# Patient Record
Sex: Male | Born: 1960
Health system: Southern US, Community
[De-identification: ages and names within clinical notes are randomized; demographics above are authoritative.]

## PROBLEM LIST (undated history)

## (undated) DIAGNOSIS — N529 Male erectile dysfunction, unspecified: Secondary | ICD-10-CM

## (undated) DIAGNOSIS — D649 Anemia, unspecified: Secondary | ICD-10-CM

## (undated) DIAGNOSIS — R55 Syncope and collapse: Secondary | ICD-10-CM

## (undated) DIAGNOSIS — R490 Dysphonia: Secondary | ICD-10-CM

## (undated) DIAGNOSIS — C801 Malignant (primary) neoplasm, unspecified: Secondary | ICD-10-CM

## (undated) DIAGNOSIS — T8859XA Other complications of anesthesia, initial encounter: Secondary | ICD-10-CM

## (undated) DIAGNOSIS — K219 Gastro-esophageal reflux disease without esophagitis: Secondary | ICD-10-CM

## (undated) DIAGNOSIS — T4145XA Adverse effect of unspecified anesthetic, initial encounter: Secondary | ICD-10-CM

## (undated) DIAGNOSIS — C4490 Unspecified malignant neoplasm of skin, unspecified: Secondary | ICD-10-CM

## (undated) DIAGNOSIS — G459 Transient cerebral ischemic attack, unspecified: Secondary | ICD-10-CM

## (undated) DIAGNOSIS — Z87442 Personal history of urinary calculi: Secondary | ICD-10-CM

## (undated) DIAGNOSIS — Z8489 Family history of other specified conditions: Secondary | ICD-10-CM

## (undated) DIAGNOSIS — E872 Acidosis, unspecified: Secondary | ICD-10-CM

## (undated) DIAGNOSIS — R519 Headache, unspecified: Secondary | ICD-10-CM

## (undated) DIAGNOSIS — E785 Hyperlipidemia, unspecified: Secondary | ICD-10-CM

## (undated) DIAGNOSIS — D099 Carcinoma in situ, unspecified: Secondary | ICD-10-CM

## (undated) DIAGNOSIS — H548 Legal blindness, as defined in USA: Secondary | ICD-10-CM

## (undated) DIAGNOSIS — I10 Essential (primary) hypertension: Secondary | ICD-10-CM

## (undated) DIAGNOSIS — M1990 Unspecified osteoarthritis, unspecified site: Secondary | ICD-10-CM

## (undated) DIAGNOSIS — G43909 Migraine, unspecified, not intractable, without status migrainosus: Secondary | ICD-10-CM

## (undated) DIAGNOSIS — R51 Headache: Secondary | ICD-10-CM

## (undated) DIAGNOSIS — E119 Type 2 diabetes mellitus without complications: Secondary | ICD-10-CM

## (undated) DIAGNOSIS — Z87898 Personal history of other specified conditions: Secondary | ICD-10-CM

## (undated) DIAGNOSIS — I639 Cerebral infarction, unspecified: Secondary | ICD-10-CM

## (undated) DIAGNOSIS — H547 Unspecified visual loss: Secondary | ICD-10-CM

## (undated) DIAGNOSIS — Z8719 Personal history of other diseases of the digestive system: Secondary | ICD-10-CM

## (undated) HISTORY — PX: OTHER SURGICAL HISTORY: SHX169

## (undated) HISTORY — PX: ROTATOR CUFF REPAIR: SHX139

---

## 1989-03-07 HISTORY — PX: HERNIA REPAIR: SHX51

## 2004-01-21 ENCOUNTER — Ambulatory Visit: Payer: Self-pay | Admitting: Internal Medicine

## 2006-09-20 ENCOUNTER — Ambulatory Visit: Payer: Self-pay | Admitting: Gastroenterology

## 2006-11-29 ENCOUNTER — Ambulatory Visit: Payer: Self-pay | Admitting: Orthopaedic Surgery

## 2007-03-08 DIAGNOSIS — G459 Transient cerebral ischemic attack, unspecified: Secondary | ICD-10-CM

## 2007-03-08 HISTORY — DX: Transient cerebral ischemic attack, unspecified: G45.9

## 2007-03-22 ENCOUNTER — Ambulatory Visit: Payer: Self-pay | Admitting: Orthopaedic Surgery

## 2008-03-11 ENCOUNTER — Ambulatory Visit: Payer: Self-pay

## 2008-03-14 ENCOUNTER — Ambulatory Visit: Payer: Self-pay | Admitting: Internal Medicine

## 2009-08-14 ENCOUNTER — Ambulatory Visit: Payer: Self-pay | Admitting: Ophthalmology

## 2010-03-07 DIAGNOSIS — Z87442 Personal history of urinary calculi: Secondary | ICD-10-CM

## 2010-03-07 HISTORY — DX: Personal history of urinary calculi: Z87.442

## 2010-03-12 ENCOUNTER — Ambulatory Visit: Payer: Self-pay | Admitting: Internal Medicine

## 2011-02-02 ENCOUNTER — Ambulatory Visit: Payer: Self-pay | Admitting: Internal Medicine

## 2011-02-05 ENCOUNTER — Ambulatory Visit: Payer: Self-pay | Admitting: Internal Medicine

## 2011-03-14 ENCOUNTER — Ambulatory Visit: Payer: Self-pay | Admitting: Internal Medicine

## 2011-04-08 ENCOUNTER — Ambulatory Visit: Payer: Self-pay | Admitting: Internal Medicine

## 2014-03-04 ENCOUNTER — Emergency Department: Payer: Self-pay | Admitting: Emergency Medicine

## 2014-05-01 ENCOUNTER — Ambulatory Visit: Payer: Self-pay | Admitting: Surgery

## 2014-05-08 ENCOUNTER — Ambulatory Visit: Payer: Self-pay | Admitting: Surgery

## 2014-05-13 ENCOUNTER — Ambulatory Visit: Payer: Self-pay | Admitting: Surgery

## 2014-05-14 ENCOUNTER — Encounter
Admit: 2014-05-14 | Disposition: A | Discharge status: Home / Self Care | Payer: Self-pay | Attending: Internal Medicine | Admitting: Internal Medicine

## 2014-06-06 ENCOUNTER — Encounter
Admit: 2014-06-06 | Disposition: A | Discharge status: Home / Self Care | Payer: Self-pay | Attending: Internal Medicine | Admitting: Internal Medicine

## 2014-06-11 ENCOUNTER — Encounter
Admit: 2014-06-11 | Disposition: A | Discharge status: Home / Self Care | Payer: Self-pay | Attending: Surgery | Admitting: Surgery

## 2014-07-06 NOTE — Op Note (Signed)
PATIENT NAME:  Ronald Trujillo, Ronald Trujillo MR#:  [id number] DATE OF BIRTH:  1960-12-19  DATE OF PROCEDURE:  05/13/2014  PREOPERATIVE DIAGNOSIS: Impingement/tendinopathy with superior labral tear from anterior to posterior and degenerative joint disease of acromioclavicular joint, right shoulder.   POSTOPERATIVE DIAGNOSES: Impingement/tendinopathy with superior labral tear from anterior to posterior, biceps tendinopathy, and degenerative joint disease of acromioclavicular joint, right shoulder.   PROCEDURES: Arthroscopic superior labral tear from anterior to posterior repair, arthroscopic subacromial decompression, arthroscopic distal clavicle excision, and mini-open biceps tenodesis, right shoulder.   SURGEON: Pascal Lux, MD  ANESTHESIA: General endotracheal with an interscalene block placed preoperative by the anesthesiologist.   FINDINGS: As noted above. The rotator cuff itself was in excellent condition. The labrum was torn from approximately the 11 o'clock to the 1 o'clock position. There were tendinopathic changes involving the insertional portion of the long head of the biceps tendon. There were grade 1 to 2 chondromalacial changes involving the humeral head and glenoid.   COMPLICATIONS: None.   ESTIMATED BLOOD LOSS: Minimal.   TOTAL FLUIDS: Crystalloid 1000 mL.  TOURNIQUET: None.   DRAINS: None.   CLOSURE: Staples.   BRIEF CLINICAL NOTE: The patient is a 54 year old male with a several month history of progressively worsening right shoulder pain. His symptoms have progressed despite medications, activity modification, injection, et Ronney Asters. His history and examination are consistent with impingement/tendinopathy and a possible SLAP tear. The MRI scan confirmed the presence of a SLAP tear as well as demonstrating degenerative changes of the [facility] joint but no rotator cuff tears. He presents at this time for arthroscopy, debridement, SLAP repair, decompression, excision of distal clavicle,  and probable biceps tenodesis.   PROCEDURE: The patient was brought into the operating room and lain in the supine position. After adequate IV sedation was achieved, an interscalene block was placed by the anesthesiologist. The patient then underwent general endotracheal intubation and anesthesia before being repositioned in the beach chair position using the beach chair positioner. The right shoulder and upper extremity were prepped with ChloraPrep solution before being draped sterilely. Preoperative antibiotics were administered. The expected portal sites and incision site were injected with 0.5% Sensorcaine with epinephrine before the camera was placed in the posterior portal. The glenohumeral joint was thoroughly inspected with the findings as described above. An anterior portal was created using an outside-in technique. The labrum and rotator cuff were carefully probed, again confirming the above noted findings. The frayed portions of the labral rim were debrided using the full radius dissector, before these edges were lightly annealed with the ArthroCare wand. The ArthroCare wand also was used to transect the biceps tendon from its attachment to the labrum in preparation for later tenodesis. The exposed glenoid rim was roughened with an end-cutting liberator and full radius resector down to bleeding bone. A separate superolateral portal site was created and the labrum repaired using a single BioKnotless anchor placed in approximately the 12 o'clock position. Subsequent probing at the repair demonstrated excellent stability. The instruments were removed from the joint, after suctioning the excess fluid.   The camera was repositioned through the posterior portal into the subacromial space. A separate lateral portal was created and a full radius resector introduced to remove any bursal tissues. The ArthroCare wand was inserted and used to remove the periosteal tissue off the undersurface of the anterior third  of the acromion as well as to recess the coracoacromial ligament from its attachment along the anterior and lateral margins of the acromion. A 4  mm bur was used to complete the decompression by removing the undersurface of the anterior third of the acromion.   The ArthroCare wand was reinserted and used to take down the inferior capsular tissue from the [facility] joint as well as to denude the undersurface of the distal clavicle. The 4 mm acromionizing bur was used to remove the undersurface of the distal clavicle for approximately 10 to 12 mm. The camera was repositioned in the lateral portal and the 5.5 mm bur was introduced through the anterior portal. The distal clavicle excision was completed using the 5.5 mm bur. The excess bony debris was removed using the full radius resector, before the ArthroCare wand was reinserted to obtain hemostasis. The camera was repositioned in the anterior portal to be sure that an adequate distal clavicle excision was performed. Once this was verified, the instruments were removed from the subacromial space after suctioning the excess fluid.   The superolateral portal site was extended for a total of 4 to 5 cm, beginning at the anterolateral corner of the acromion extending distally parallel to the bicipital groove. The subcutaneous tissues were dissected to expose the deltoid fascia. The raphe between the anterior and middle thirds was identified and this plane developed to provide access into the subacromial space. Additional bursal tissues were debrided sharply using Metzenbaum scissors before the bicipital groove was identified by palpation. It was opened for 1 to 2 cm before the biceps tendon was retrieved through this defect. The floor of the bicipital groove was roughened with a rongeur, before a single Biomet 2.9 mm JuggerKnot anchor was inserted. Both sets of sutures were passed through the tendon and tied securely to effect the tenodesis. The excess tendon was removed using  a #15 blade before two #0 Ethibond interrupted sutures were placed to close the bicipital sheath while incorporating the biceps tendon to further reinforce the tenodesis.   The wound was copiously irrigated with sterile saline solution before the deltoid raphe was reapproximated using 2-0 Vicryl interrupted sutures. The subcutaneous tissues were closed in two layers using 2-0 Vicryl interrupted sutures before the skin was closed using staples. The portal sites also were closed using staples. A sterile bulky dressing was applied to the shoulder before the arm was placed into a shoulder immobilizer. The patient was then awakened, extubated, and returned to the recovery room in satisfactory condition after tolerating the procedure well.    ____________________________ J. Dorien Chihuahua, MD jjp:TM D: 05/13/2014 11:16:27 ET T: 05/13/2014 20:43:21 ET JOB#: [id number]  cc: Pascal Lux, MD, <Dictator> Pascal Lux MD ELECTRONICALLY SIGNED 05/20/2014 15:02

## 2014-07-06 NOTE — Discharge Summary (Signed)
PATIENT NAME:  Ronald Trujillo, Ronald Trujillo MR#:  [id number] DATE OF BIRTH:  21-Jun-1960  DATE OF ADMISSION:  05/13/2014 DATE OF DISCHARGE:  05/14/2014  ADMITTING DIAGNOSIS: Impingement tendinopathy with superior labral tear from anterior to posterior, and degenerative joint disease of acromioclavicular joint of the right shoulder.   DISCHARGE DIAGNOSIS: Impingement tendinopathy with superior labral tear from anterior to posterior, and degenerative joint disease of acromioclavicular joint of the right shoulder.   OPERATION: On 05/13/2014, the patient had an arthroscopic superior labral tear from anterior to posterior repair with arthroscopic subacromial decompression, arthroscopic distal clavicle excision, and mini open biceps tenodesis of the right shoulder.   SURGEON: Pascal Lux, M.D.   ANESTHESIA: General endotracheal with interscalene block.   COMPLICATIONS: None.   ESTIMATED BLOOD LOSS: Minimal.   The patient was stabilized and brought to the recovery room, then brought down to the orthopedic floor for pain control and possible referral to rehabilitation.   HISTORY OF PRESENT ILLNESS: The patient is a 54 year old male, who presented for persistent right shoulder pain with an MRI revealing a superior labral tear, as well as degenerative changes. The patient has been refractory to conservative treatment and has continued to have pain going in his shoulder and having difficulty at nighttime and with overhead activities.    HOSPITAL COURSE: On 05/13/2014, the patient had surgery and was brought to the operating room. Then after surgery, was brought back down to the orthopedic floor. On postoperative day 1, the patient was doing well with physical therapy and based on his family's situation with being home by himself and being blind, he could not take care of himself. He was sent to rehabilitation on 05/14/2014.   CONDITION AT DISCHARGE: Stable.   DISPOSITION: The patient was sent to  rehabilitation.   DISCHARGE INSTRUCTIONS: The patient will follow up with [facility] in about 7 to 10 days for dressing care and suture and staple removal.  The patient will weight-bear as tolerated on the affected extremity, with using his shoulder immobilizer at all times, remove only for bathing. The patient will elevate his heels off the bed and be encouraged to do cough and deep breathing. The patient will resume a regular diet. He will use a Polar Care to decrease swelling, try to keep his dressing clean and dry, try not to get it wet. The patient will have a dressing change on a p.r.n. basis. The patient will call the [facility] if there is any bright red bleeding, calf pain, or any bowel or bladder difficulty, or any fever greater than 101.5. The patient will do physical therapy and occupational therapy per protocol at rehabilitation.   DISCHARGE MEDICATIONS:  Lisinopril 20 mg 1 tablet daily, Plavix 75 mg 1 tablet daily, lovastatin 20 mg 1 tablet daily, Tylenol 325 mg 2 tablets q.4 h. as needed for pain, oxycodone 5 mg 1 tablet q.4 h. as needed for severe pain, tramadol 50 mg 1 tablet q.4 h. as needed for moderate pain.   ____________________________ J. Reche Dixon, [place] jtm:JT D: 05/14/2014 09:09:27 ET T: 05/14/2014 09:19:15 ET JOB#: [id number]  cc: J. Reche Dixon, [place], <Dictator> J Oliver Neuwirth [facility] PA ELECTRONICALLY SIGNED 05/15/2014 6:33

## 2014-07-07 ENCOUNTER — Ambulatory Visit: Payer: PPO | Attending: Surgery

## 2014-07-07 DIAGNOSIS — S4991XS Unspecified injury of right shoulder and upper arm, sequela: Secondary | ICD-10-CM | POA: Insufficient documentation

## 2014-07-07 DIAGNOSIS — X58XXXS Exposure to other specified factors, sequela: Secondary | ICD-10-CM | POA: Diagnosis not present

## 2014-07-07 DIAGNOSIS — M25512 Pain in left shoulder: Secondary | ICD-10-CM | POA: Diagnosis not present

## 2014-07-07 DIAGNOSIS — M25612 Stiffness of left shoulder, not elsewhere classified: Secondary | ICD-10-CM | POA: Insufficient documentation

## 2014-07-07 DIAGNOSIS — M25511 Pain in right shoulder: Secondary | ICD-10-CM

## 2014-07-07 DIAGNOSIS — M25611 Stiffness of right shoulder, not elsewhere classified: Secondary | ICD-10-CM

## 2014-07-07 NOTE — Patient Instructions (Signed)
No new HEP issued today

## 2014-07-07 NOTE — Therapy (Signed)
[place] MAIN [facility] SERVICES [address] [place], [place], [address] Phone: [phone]   Fax:  [phone]  Physical Therapy Treatment  Patient Details  Name: SUBHAN HOOPES MRN: [id number] Date of Birth: [birth year] Referring Provider:  Corky Mull, MD  Encounter Date: 07/07/2014      PT End of Session - 07/07/14 1149    Visit Number 8   Number of Visits 24   Date for PT Re-Evaluation 09/03/14   Authorization Type Medicare G-codes 8 of 10   PT Start Time 1057   PT Stop Time 1135   PT Time Calculation (min) 38 min   Activity Tolerance Patient tolerated treatment well   Behavior During Therapy [facility] for tasks assessed/performed      Past Medical History  Diagnosis Date  . Anxiety     Past Surgical History  Procedure Laterality Date  . Hernia repair    . Rotator cuff repair Left 2004    There were no vitals filed for this visit.  Visit Diagnosis:  Pain in joint, shoulder region, right  Shoulder stiffness, right      Subjective Assessment - 07/07/14 1138    Subjective " My shoulder is feeling pretty good!"   Patient Stated Goals full funciton of RUE   Currently in Pain? Yes   Pain Score 1    Pain Location Shoulder   Pain Orientation Right   Pain Descriptors / Indicators Aching            [facility] PT Assessment - 07/07/14 0001    Assessment   Medical Diagnosis superior glenoid labrum lesion of right shoulder subsequent encounter   Onset Date 05/13/14   Next MD Visit not scheduled   Precautions   Precautions Shoulder  SLAP protocol   Balance Screen   Has the patient fallen in the past 6 months Yes   How many times? 1   Has the patient had a decrease in activity level because of a fear of falling?  No   Is the patient reluctant to leave their home because of a fear of falling?  No   Home Environment   Living Enviornment Private residence   Prior Function   Level of Independence Independent with basic  ADLs;Independent with homemaking with ambulation   Observation/Other Assessments   Other Surveys  Select   Quick DASH  --  EVAL: 70% disability   ROM / Strength   AROM / PROM / Strength AROM;PROM;Strength   AROM   Overall AROM  Within functional limits for tasks performed   Overall AROM Comments Bilateral sholulder AROM WNL   AROM Assessment Site Shoulder   PROM   Overall PROM  Within functional limits for tasks performed   Strength   Strength Assessment Site Shoulder   Right/Left Shoulder Right   Right Shoulder Flexion 4-/5   Right Shoulder Extension 4-/5   Right Shoulder ABduction 4-/5   Right Shoulder Internal Rotation 4/5   Right Shoulder External Rotation 4-/5                     [facility] Adult PT Treatment/Exercise - 07/07/14 0001    Exercises   Exercises Shoulder   Shoulder Exercises: Seated   Other Seated Exercises UBE x 3 min no charge warm up   Shoulder Exercises: Standing   Horizontal ABduction Both;Theraband  2x10   Theraband Level (Shoulder Horizontal ABduction) Level 1 (Yellow)   External Rotation Strengthening;Right;Theraband  2x10  Theraband Level (Shoulder External Rotation) Level 1 (Yellow)   Internal Rotation Strengthening;Right;Theraband  2x10   Theraband Level (Shoulder Internal Rotation) Level 2 (Red)   Shoulder ABduction Weight (lbs) 2.2lbs 2 x 10   Row Strengthening;10 reps;Both;Theraband  2x10   Theraband Level (Shoulder Row) Level 2 (Red)   Shoulder Elevation Strengthening;Both  2x10 , 3lbs   Other Standing Exercises --  D1 ext with red band 2x10, D2 flexion 1lb 2x10   Shoulder Exercises: ROM/Strengthening   Pushups --  2x10 wall push up   Shoulder Exercises: Body Blade   Flexion 15 seconds;3 reps   ABduction 15 seconds;3 reps   External Rotation 15 seconds;3 reps   Manual Therapy   Manual Therapy Joint mobilization;Passive ROM   Joint Mobilization --  Grade 3-4 AP and inferior GH glides 30s, 3 bouts-    Passive ROM --   PROM into flexion, abduction, ER/IR x 5 reps each gentle OP                PT Education - 07/07/14 1148    Education provided Yes   Education Details AAROM using cane or towel to improve IR- advised not to pull too hard.   Person(s) Educated Patient   Methods Explanation   Comprehension Verbalized understanding          PT Short Term Goals - 07/07/14 1158    PT SHORT TERM GOAL #1   Title pt will be independent with HEP for prevention/ self-management of shoulder symptoms / difficulty   Time 4   Period Weeks   Status Achieved   PT SHORT TERM GOAL #2   Title pt to demonstate PFE 145 deg, PER 20 deg, abd 45-60deg, PER at 90 and abd 45 deg in 4 weeks.    Time 4   Period Weeks   Status Achieved   PT SHORT TERM GOAL #3   Title pt to recall precautions for full time sling use, demonstrates independent donning/doffing, sleeping positions, icing at home independently.    Time 1   Period Weeks   Status Achieved           PT Long Term Goals - 07/07/14 1201    PT LONG TERM GOAL #1   Title patient will improve AROM UE so they are able to perform overhead ADLs such as reaching into cabinets    Time 12   Period Weeks   Status Partially Met   PT LONG TERM GOAL #2   Title patient will improve shoulder PROM greater than 140 degrees into flexion, scaption and abduction to equal that of contralateral side for improved ability to perform strengthening activities    Time 8   Period Weeks   Status Achieved   PT LONG TERM GOAL #3   Title patient will decrease Quick Dash score by >8pts demonsrating reduced self-reproted UE disability    Time 8   Period Weeks   Status On-going   PT LONG TERM GOAL #4   Title pt to be independent iwth HEP for long term strengthening   Time 12   Period Weeks   Status Partially Met               Plan - 07/07/14 1154    Clinical Impression Statement pt is progressing towards goals. PT progressed therex into resistance exercises today  without increasing pain. pt requires min-mod verbal/tacitle cues for proper performance of exercises.   Pt will benefit from skilled therapeutic intervention in order to improve  on the following deficits Decreased strength;Pain;Impaired UE functional use;Impaired flexibility;Postural dysfunction   Rehab Potential Good   PT Frequency 2x / week   PT Duration 12 weeks   PT Treatment/Interventions Cryotherapy;Moist Heat;Therapeutic exercise;Manual techniques;Electrical Stimulation;Dry needling;Passive range of motion;Ultrasound;Aquatic Therapy;Therapeutic activities   PT Home Exercise Plan progress as tolerated   Consulted and Agree with Plan of Care Patient          G-Codes - Jul 16, 2014 1205    Functional Assessment Tool Used quick dash/ ROM, clinical judgement   Functional Limitation Carrying, moving and handling objects   Carrying, Moving and Handling Objects Current Status (E4731) At least 20 percent but less than 40 percent impaired, limited or restricted   Carrying, Moving and Handling Objects Goal Status (R2438) At least 1 percent but less than 20 percent impaired, limited or restricted      Problem List There are no active problems to display for this patient.   Tricia Pledger 07-16-2014, 12:15 PM  [place] MAIN [facility] SERVICES [address] [place], [place], [address] Phone: [phone]   Fax:  [phone]

## 2014-07-09 ENCOUNTER — Ambulatory Visit: Payer: PPO

## 2014-07-09 DIAGNOSIS — S4991XS Unspecified injury of right shoulder and upper arm, sequela: Secondary | ICD-10-CM | POA: Diagnosis not present

## 2014-07-09 DIAGNOSIS — M25611 Stiffness of right shoulder, not elsewhere classified: Secondary | ICD-10-CM

## 2014-07-09 DIAGNOSIS — M25511 Pain in right shoulder: Secondary | ICD-10-CM

## 2014-07-09 NOTE — Therapy (Signed)
[place] MAIN [facility] SERVICES [address] [place], [place], [address] Phone: [phone]   Fax:  [phone]  Physical Therapy Treatment  Patient Details  Name: EDD REPPERT MRN: [id number] Date of Birth: 03/09/1960 Referring Provider:  Corky Mull, MD  Encounter Date: 07/09/2014      PT End of Session - 07/09/14 1134    Visit Number 9   Number of Visits 24   Date for PT Re-Evaluation 07/14/14   Authorization Type medicare G code 9/10   PT Start Time 1100   PT Stop Time 1135   PT Time Calculation (min) 35 min   Activity Tolerance Patient tolerated treatment well   Behavior During Therapy [facility] for tasks assessed/performed      Past Medical History  Diagnosis Date  . Anxiety     Past Surgical History  Procedure Laterality Date  . Hernia repair    . Rotator cuff repair Left 2004    There were no vitals filed for this visit.  Visit Diagnosis:  Pain in joint, shoulder region, right  Shoulder stiffness, right      Subjective Assessment - 07/09/14 1102    Subjective "my shoulder feels ok , it just hurts at the top   Currently in Pain? Yes   Pain Score 3    Pain Location Shoulder   Pain Orientation Right   Pain Descriptors / Indicators Aching                         [facility] Adult PT Treatment/Exercise - 07/09/14 0001    Exercises   Exercises Shoulder   Shoulder Exercises: Seated   Other Seated Exercises UBE x 3 min no charge warm up   Shoulder Exercises: Standing   Horizontal ABduction Both;Theraband  2x10   Theraband Level (Shoulder Horizontal ABduction) Level 1 (Yellow)   External Rotation Strengthening;Right;Theraband  2x10   Theraband Level (Shoulder External Rotation) Level 1 (Yellow)   Internal Rotation Strengthening;Right;Theraband  2x10   Theraband Level (Shoulder Internal Rotation) Level 2 (Red)   Shoulder ABduction Weight (lbs) 2.2lbs 2 x 10   Row Strengthening;10 reps;Both;Theraband   2x10   Theraband Level (Shoulder Row) Level 2 (Red)   Shoulder Elevation Strengthening;Both  2x10 , 4lbs   Other Standing Exercises --  D1 ext with red band 2x15, D2 flexion 2lb 2x10   Shoulder Exercises: ROM/Strengthening   Pushups --  2x10 wall push up   Shoulder Exercises: Stretch   Corner Stretch 2 reps;20 seconds   Cross Chest Stretch 2 reps;20 seconds   Shoulder Exercises: Body Blade   Flexion 15 seconds;3 reps   ABduction 15 seconds;3 reps   External Rotation 15 seconds;3 reps   Modalities   Modalities --   Manual Therapy   Manual Therapy --   Joint Mobilization --   Passive ROM --                  PT Short Term Goals - 07/07/14 1158    PT SHORT TERM GOAL #1   Title pt will be independent with HEP for prevention/ self-management of shoulder symptoms / difficulty   Time 4   Period Weeks   Status Achieved   PT SHORT TERM GOAL #2   Title pt to demonstate PFE 145 deg, PER 20 deg, abd 45-60deg, PER at 90 and abd 45 deg in 4 weeks.    Time 4   Period Weeks   Status  Achieved   PT SHORT TERM GOAL #3   Title pt to recall precautions for full time sling use, demonstrates independent donning/doffing, sleeping positions, icing at home independently.    Time 1   Period Weeks   Status Achieved           PT Long Term Goals - 07/07/14 1201    PT LONG TERM GOAL #1   Title patient will improve AROM UE so they are able to perform overhead ADLs such as reaching into cabinets    Time 12   Period Weeks   Status Partially Met   PT LONG TERM GOAL #2   Title patient will improve shoulder PROM greater than 140 degrees into flexion, scaption and abduction to equal that of contralateral side for improved ability to perform strengthening activities    Time 8   Period Weeks   Status Achieved   PT LONG TERM GOAL #3   Title patient will decrease Quick Dash score by >8pts demonsrating reduced self-reproted UE disability    Time 8   Period Weeks   Status On-going   PT  LONG TERM GOAL #4   Title pt to be independent iwth HEP for long term strengthening   Time 12   Period Weeks   Status Partially Met               Plan - 07/09/14 1137    Clinical Impression Statement pt continues to progress well regarding strengthening, activity tolerance, pain and functional return of UE use of the R shoulder. pt does still need min -mod verbal and tactile cues for some exercise performance and safe pacing. plan to discharge to home strengthening once exrecise performance is  independent.    Pt will benefit from skilled therapeutic intervention in order to improve on the following deficits Decreased strength;Pain;Impaired UE functional use;Impaired flexibility;Postural dysfunction   Rehab Potential Good   PT Frequency 2x / week   PT Duration 12 weeks   PT Treatment/Interventions Cryotherapy;Moist Heat;Therapeutic exercise;Manual techniques;Electrical Stimulation;Dry needling;Passive range of motion;Ultrasound;Aquatic Therapy;Therapeutic activities   PT Home Exercise Plan progress as tolerated   Consulted and Agree with Plan of Care Patient        Problem List There are no active problems to display for this patient.  Ashley C. Tortorici, PT, DPT #[id number]  Tortorici,Ashley 07/09/2014, 11:43 AM   [facility] MAIN REHAB SERVICES [address] [place], [place], [address] Phone: [phone]   Fax:  [phone]      

## 2014-07-14 ENCOUNTER — Ambulatory Visit: Payer: PPO

## 2014-07-14 DIAGNOSIS — M25611 Stiffness of right shoulder, not elsewhere classified: Secondary | ICD-10-CM

## 2014-07-14 DIAGNOSIS — M25511 Pain in right shoulder: Secondary | ICD-10-CM

## 2014-07-14 DIAGNOSIS — S4991XS Unspecified injury of right shoulder and upper arm, sequela: Secondary | ICD-10-CM | POA: Diagnosis not present

## 2014-07-14 NOTE — Therapy (Signed)
[place] MAIN [facility] SERVICES [address] [place], [place], [address] Phone: [phone]   Fax:  [phone]  Physical Therapy Treatment  Patient Details  Name: Ronald Trujillo MRN: [id number] Date of Birth: [birth year] Referring Provider:  Corky Mull, MD  Encounter Date: 07/14/2014      PT End of Session - 07/14/14 1135    PT Stop Time 3007      Past Medical History  Diagnosis Date  . Anxiety     Past Surgical History  Procedure Laterality Date  . Hernia repair    . Rotator cuff repair Left 2004    There were no vitals filed for this visit.  Visit Diagnosis:  Pain in joint, shoulder region, right  Shoulder stiffness, right      Subjective Assessment - 07/14/14 1122    Subjective pt reports his shoulder is doing fine, reports he did a lot of stirring and cooking this weekend. He reports his shoulder was a little sore but ok. pt reports he pulled a muscle in his Tspine that is bothering him.    Patient Stated Goals full funciton of RUE   Pain Score 3    Pain Location Thoracic  pt reports no shoulder pain   Pain Descriptors / Indicators Aching        pt briefly instructed on thoracic extension over towel for T spine pain, which helped. Pt able to tolerate therex without increase in T spine pain                 [facility] Adult PT Treatment/Exercise - 07/14/14 1124    Exercises   Exercises Shoulder   Shoulder Exercises: Seated   Other Seated Exercises UBE x 3 min no charge warm up   Shoulder Exercises: Standing   Horizontal ABduction Both;Theraband  3x10   Theraband Level (Shoulder Horizontal ABduction) Level 1 (Yellow)   External Rotation Strengthening;Right;Theraband  3x10   Theraband Level (Shoulder External Rotation) Level 1 (Yellow)   Internal Rotation Strengthening;Right;Theraband  3x10   Theraband Level (Shoulder Internal Rotation) Level 2 (Red)   Shoulder ABduction Weight (lbs) 2.2lbs 2 x 10   Row Strengthening;10 reps;Both;Theraband  3x10   Theraband Level (Shoulder Row) Level 2 (Red)   Shoulder Elevation Strengthening;Both  3x10 , 4lbs   Other Standing Exercises --  D1 ext with red band 2x15, D2 flexion 2lb 2x10   Shoulder Exercises: ROM/Strengthening   Pushups --  2x10 wall push up   Shoulder Exercises: Stretch   Corner Stretch 2 reps;30 seconds   Cross Chest Stretch 2 reps;30 seconds   Shoulder Exercises: Body Blade   Flexion 15 seconds;3 reps   ABduction 15 seconds;3 reps   External Rotation 15 seconds;3 reps                PT Education - 07/14/14 1125    Education provided Yes   Education Details pacing of exercises    Person(s) Educated Patient          PT Short Term Goals - 07/14/14 1157    PT SHORT TERM GOAL #1   Title pt will be independent with HEP for prevention/ self-management of shoulder symptoms / difficulty   Time 4   Period Weeks   Status Achieved   PT SHORT TERM GOAL #2   Title pt to demonstate PFE 145 deg, PER 20 deg, abd 45-60deg, PER at 90 and abd 45 deg in 4 weeks.    Time 4  Period Weeks   Status Achieved   PT SHORT TERM GOAL #3   Title pt to recall precautions for full time sling use, demonstrates independent donning/doffing, sleeping positions, icing at home independently.    Time 1   Period Weeks   Status Achieved           PT Long Term Goals - 12-Aug-2014 1157    PT LONG TERM GOAL #1   Title patient will improve AROM UE so they are able to perform overhead ADLs such as reaching into cabinets    Time 12   Period Weeks   Status Achieved   PT LONG TERM GOAL #2   Title patient will improve shoulder PROM greater than 140 degrees into flexion, scaption and abduction to equal that of contralateral side for improved ability to perform strengthening activities    Time 8   Period Weeks   Status Achieved   PT LONG TERM GOAL #3   Title patient will decrease Quick Dash score by >8pts demonsrating reduced self-reproted UE  disability    Time 8   Period Weeks   Status Achieved   PT LONG TERM GOAL #4   Title pt to be independent iwth HEP for long term strengthening   Time 12   Period Weeks   Status On-going               Plan - 08/12/14 1155    Clinical Impression Statement pt was given written progressed HEP with pictures today with theraband. pt instructed to try exercises tomororw and return weds with any questions, however planning for DC to HEP next visist as pt is >75% independent with HEP   Pt will benefit from skilled therapeutic intervention in order to improve on the following deficits Decreased strength;Pain;Impaired UE functional use;Impaired flexibility;Postural dysfunction   Rehab Potential Good   PT Frequency 2x / week   PT Duration 12 weeks   PT Treatment/Interventions Cryotherapy;Moist Heat;Therapeutic exercise;Manual techniques;Electrical Stimulation;Dry needling;Passive range of motion;Ultrasound;Aquatic Therapy;Therapeutic activities   PT Next Visit Plan discharge pending HEP   PT Home Exercise Plan try new HEP          G-Codes - August 12, 2014 1158    Functional Assessment Tool Used quick dash/ ROM, clinical judgement   Functional Limitation Carrying, moving and handling objects   Carrying, Moving and Handling Objects Current Status (T3646) At least 1 percent but less than 20 percent impaired, limited or restricted   Carrying, Moving and Handling Objects Goal Status (O0321) At least 1 percent but less than 20 percent impaired, limited or restricted      Problem List There are no active problems to display for this patient. Gorden Harms. Alinna Siple, PT, DPT [phone]   Jeronda Don 2014/08/12, 12:01 PM  [place] [facility] MAIN [facility] SERVICES [address] [place], [place], [address] Phone: [phone]   Fax:  [phone]

## 2014-07-14 NOTE — Patient Instructions (Signed)
[email or web address]: Doorway stretch 30s x 2 Bicep stretch 30s x 2 Low row: red band 3x10 Shoulder IR/ER red band 3x10 D2 flexion 2lbs 2x10 D1 extension red band 2x10 Shoulder horiz abduction yellow band 3x10

## 2014-07-16 ENCOUNTER — Ambulatory Visit: Payer: PPO

## 2014-07-16 DIAGNOSIS — S4991XS Unspecified injury of right shoulder and upper arm, sequela: Secondary | ICD-10-CM | POA: Diagnosis not present

## 2014-07-16 DIAGNOSIS — M25511 Pain in right shoulder: Secondary | ICD-10-CM

## 2014-07-16 DIAGNOSIS — M25611 Stiffness of right shoulder, not elsewhere classified: Secondary | ICD-10-CM

## 2014-07-16 NOTE — Therapy (Signed)
[place] MAIN [facility] SERVICES [address] [place], [place], [address] Phone: [phone]   Fax:  [phone]  Physical Therapy Treatment  Patient Details  Name: Ronald Trujillo MRN: [id number] Date of Birth: [birth year] Referring Provider:  Corky Mull, MD  Encounter Date: 07/16/2014      PT End of Session - 07/16/14 1231    Visit Number 11   Number of Visits 25   Date for PT Re-Evaluation 07/16/14   PT Start Time 1120   PT Stop Time 1145   PT Time Calculation (min) 25 min   Activity Tolerance Patient tolerated treatment well   Behavior During Therapy [facility] for tasks assessed/performed      Past Medical History  Diagnosis Date  . Anxiety     Past Surgical History  Procedure Laterality Date  . Hernia repair    . Rotator cuff repair Left 2004    There were no vitals filed for this visit.  Visit Diagnosis:  Pain in joint, shoulder region, right  Shoulder stiffness, right      Subjective Assessment - 07/16/14 1153    Subjective pt reports his shoulder is doing fine, and he is able to do all activities with minimal pain.     Patient Stated Goals full funciton of RUE   Currently in Pain? Yes   Pain Score 1    Pain Location Shoulder   Pain Orientation Right   Pain Descriptors / Indicators Aching       PT reassessed the following exam measures      [facility] PT Assessment - 07/16/14 0001    Observation/Other Assessments   Quick DASH  --  7% disability   ROM / Strength   AROM / PROM / Strength AROM   AROM   Overall AROM  Within functional limits for tasks performed   Overall AROM Comments Bilateral sholulder AROM WNL   AROM Assessment Site Shoulder   Strength   Strength Assessment Site Shoulder   Right/Left Shoulder Right   Right Shoulder Flexion 4/5   Right Shoulder Extension 4/5   Right Shoulder ABduction 4/5   Right Shoulder Internal Rotation 5/5   Right Shoulder External Rotation 4/5       THEREX: Incline push up with + x 10 Incline shoulder taps x 10 Shoulder abduction "around the world" with 2.2lb med ball x 10 each way Ball toss x 10 2.2lb ball Shoulder ER at 90deg flexion with 2lbs 2x10 Pt required min cues for proper preformance no pain with exercises.                        PT Education - 07/16/14 1231    Education provided Yes   Education Details avoid heavy lifting , continue HEP for at least 8 weeks   Person(s) Educated Patient   Methods Explanation   Comprehension Verbalized understanding          PT Short Term Goals - 07/14/14 1157    PT SHORT TERM GOAL #1   Title pt will be independent with HEP for prevention/ self-management of shoulder symptoms / difficulty   Time 4   Period Weeks   Status Achieved   PT SHORT TERM GOAL #2   Title pt to demonstate PFE 145 deg, PER 20 deg, abd 45-60deg, PER at 90 and abd 45 deg in 4 weeks.    Time 4   Period Weeks   Status Achieved   PT  SHORT TERM GOAL #3   Title pt to recall precautions for full time sling use, demonstrates independent donning/doffing, sleeping positions, icing at home independently.    Time 1   Period Weeks   Status Achieved           PT Long Term Goals - 07/17/2014 1234    PT LONG TERM GOAL #1   Title patient will improve AROM UE so they are able to perform overhead ADLs such as reaching into cabinets    Time 12   Period Weeks   Status Achieved   PT LONG TERM GOAL #2   Title patient will improve shoulder PROM greater than 140 degrees into flexion, scaption and abduction to equal that of contralateral side for improved ability to perform strengthening activities    Time 8   Period Weeks   Status Achieved   PT LONG TERM GOAL #3   Title patient will decrease Quick Dash score by >8pts demonsrating reduced self-reproted UE disability    Time 8   Period Weeks   Status Achieved   PT LONG TERM GOAL #4   Title pt to be independent iwth HEP for long term  strengthening   Time 12   Period Weeks   Status Achieved               Plan - Jul 17, 2014 1232    Clinical Impression Statement Mr. Mozley has achived all PT goals and has full function of the R shoulder regarding ADLs and his recreational acitivity. pt will be DC to HEP at this time for continued strengthening on his own. pt encouraged to call with any questions or concerns.    Pt will benefit from skilled therapeutic intervention in order to improve on the following deficits Decreased strength;Pain;Impaired UE functional use;Impaired flexibility;Postural dysfunction   Rehab Potential Good   PT Frequency 2x / week   PT Duration 12 weeks   PT Treatment/Interventions Cryotherapy;Moist Heat;Therapeutic exercise;Manual techniques;Electrical Stimulation;Dry needling;Passive range of motion;Ultrasound;Aquatic Therapy;Therapeutic activities   PT Next Visit Plan discharge           G-Codes - 07/17/2014 1234    Functional Assessment Tool Used quick dash/ ROM, clinical judgement   Functional Limitation Carrying, moving and handling objects   Carrying, Moving and Handling Objects Current Status [phone]) At least 1 percent but less than 20 percent impaired, limited or restricted   Carrying, Moving and Handling Objects Goal Status (K5537) At least 1 percent but less than 20 percent impaired, limited or restricted   Carrying, Moving and Handling Objects Discharge Status [phone]) At least 1 percent but less than 20 percent impaired, limited or restricted     PHYSICAL THERAPY DISCHARGE SUMMARY  Visits from Start of Care:11  Current functional level related to goals / functional outcomes: Full function   Remaining deficits: Mild strength deficit, mild pain intermittant    Plan: Patient agrees to discharge.  Patient goals were met. Patient is being discharged due to meeting the stated rehab goals.  ?????       Problem List There are no active problems to display for this patient.   Gorden Harms. Kaedin Hicklin, PT, DPT [phone]  Ermagene Saidi 07/17/2014, 12:36 PM  [place] MAIN [facility] SERVICES [address] [place], [place], [address] Phone: [phone]   Fax:  [phone]

## 2014-07-21 ENCOUNTER — Ambulatory Visit: Payer: PPO

## 2014-07-23 ENCOUNTER — Ambulatory Visit: Payer: PPO

## 2014-07-28 ENCOUNTER — Ambulatory Visit: Payer: PPO

## 2014-07-31 ENCOUNTER — Ambulatory Visit: Payer: PPO

## 2014-08-05 ENCOUNTER — Ambulatory Visit: Payer: PPO

## 2014-08-07 ENCOUNTER — Ambulatory Visit: Payer: PPO

## 2015-05-19 DIAGNOSIS — H47013 Ischemic optic neuropathy, bilateral: Secondary | ICD-10-CM | POA: Diagnosis not present

## 2015-06-22 DIAGNOSIS — M150 Primary generalized (osteo)arthritis: Secondary | ICD-10-CM | POA: Diagnosis not present

## 2015-06-22 DIAGNOSIS — E1129 Type 2 diabetes mellitus with other diabetic kidney complication: Secondary | ICD-10-CM | POA: Diagnosis not present

## 2015-06-22 DIAGNOSIS — I10 Essential (primary) hypertension: Secondary | ICD-10-CM | POA: Diagnosis not present

## 2015-06-22 DIAGNOSIS — D6489 Other specified anemias: Secondary | ICD-10-CM | POA: Diagnosis not present

## 2015-06-22 DIAGNOSIS — R809 Proteinuria, unspecified: Secondary | ICD-10-CM | POA: Diagnosis not present

## 2015-06-29 DIAGNOSIS — Z125 Encounter for screening for malignant neoplasm of prostate: Secondary | ICD-10-CM | POA: Diagnosis not present

## 2015-06-29 DIAGNOSIS — E784 Other hyperlipidemia: Secondary | ICD-10-CM | POA: Diagnosis not present

## 2015-06-29 DIAGNOSIS — D6489 Other specified anemias: Secondary | ICD-10-CM | POA: Diagnosis not present

## 2015-06-29 DIAGNOSIS — I10 Essential (primary) hypertension: Secondary | ICD-10-CM | POA: Diagnosis not present

## 2015-06-29 DIAGNOSIS — R809 Proteinuria, unspecified: Secondary | ICD-10-CM | POA: Diagnosis not present

## 2015-06-29 DIAGNOSIS — E1129 Type 2 diabetes mellitus with other diabetic kidney complication: Secondary | ICD-10-CM | POA: Diagnosis not present

## 2015-06-29 DIAGNOSIS — M150 Primary generalized (osteo)arthritis: Secondary | ICD-10-CM | POA: Diagnosis not present

## 2015-09-21 DIAGNOSIS — M65311 Trigger thumb, right thumb: Secondary | ICD-10-CM | POA: Diagnosis not present

## 2015-09-21 DIAGNOSIS — D2361 Other benign neoplasm of skin of right upper limb, including shoulder: Secondary | ICD-10-CM | POA: Diagnosis not present

## 2015-09-21 DIAGNOSIS — M25541 Pain in joints of right hand: Secondary | ICD-10-CM | POA: Diagnosis not present

## 2015-10-08 ENCOUNTER — Encounter: Payer: Self-pay | Admitting: *Deleted

## 2015-10-12 DIAGNOSIS — S61431A Puncture wound without foreign body of right hand, initial encounter: Secondary | ICD-10-CM | POA: Diagnosis not present

## 2015-10-12 DIAGNOSIS — R809 Proteinuria, unspecified: Secondary | ICD-10-CM | POA: Diagnosis not present

## 2015-10-12 DIAGNOSIS — E1129 Type 2 diabetes mellitus with other diabetic kidney complication: Secondary | ICD-10-CM | POA: Diagnosis not present

## 2015-10-12 DIAGNOSIS — W5501XA Bitten by cat, initial encounter: Secondary | ICD-10-CM | POA: Diagnosis not present

## 2015-10-20 DIAGNOSIS — M1712 Unilateral primary osteoarthritis, left knee: Secondary | ICD-10-CM | POA: Diagnosis not present

## 2015-10-20 DIAGNOSIS — M25562 Pain in left knee: Secondary | ICD-10-CM | POA: Diagnosis not present

## 2015-10-20 DIAGNOSIS — M76899 Other specified enthesopathies of unspecified lower limb, excluding foot: Secondary | ICD-10-CM | POA: Diagnosis not present

## 2015-10-21 ENCOUNTER — Ambulatory Visit: Payer: PPO | Admitting: Student in an Organized Health Care Education/Training Program

## 2015-10-21 ENCOUNTER — Encounter
Admission: RE | Disposition: A | Discharge status: Home / Self Care | Payer: Self-pay | Source: Ambulatory Visit | Attending: Surgery

## 2015-10-21 ENCOUNTER — Ambulatory Visit
Admission: RE | Admit: 2015-10-21 | Discharge: 2015-10-21 | Disposition: A | Discharge status: Home / Self Care | Payer: PPO | Source: Ambulatory Visit | Attending: Surgery | Admitting: Surgery

## 2015-10-21 DIAGNOSIS — D649 Anemia, unspecified: Secondary | ICD-10-CM | POA: Diagnosis not present

## 2015-10-21 DIAGNOSIS — M1990 Unspecified osteoarthritis, unspecified site: Secondary | ICD-10-CM | POA: Diagnosis not present

## 2015-10-21 DIAGNOSIS — Z823 Family history of stroke: Secondary | ICD-10-CM | POA: Diagnosis not present

## 2015-10-21 DIAGNOSIS — R490 Dysphonia: Secondary | ICD-10-CM | POA: Diagnosis not present

## 2015-10-21 DIAGNOSIS — W010XXA Fall on same level from slipping, tripping and stumbling without subsequent striking against object, initial encounter: Secondary | ICD-10-CM | POA: Insufficient documentation

## 2015-10-21 DIAGNOSIS — M65311 Trigger thumb, right thumb: Secondary | ICD-10-CM | POA: Insufficient documentation

## 2015-10-21 DIAGNOSIS — I10 Essential (primary) hypertension: Secondary | ICD-10-CM | POA: Diagnosis not present

## 2015-10-21 DIAGNOSIS — Z79899 Other long term (current) drug therapy: Secondary | ICD-10-CM | POA: Insufficient documentation

## 2015-10-21 DIAGNOSIS — Z8249 Family history of ischemic heart disease and other diseases of the circulatory system: Secondary | ICD-10-CM | POA: Insufficient documentation

## 2015-10-21 DIAGNOSIS — R51 Headache: Secondary | ICD-10-CM | POA: Insufficient documentation

## 2015-10-21 DIAGNOSIS — Z833 Family history of diabetes mellitus: Secondary | ICD-10-CM | POA: Diagnosis not present

## 2015-10-21 DIAGNOSIS — Y939 Activity, unspecified: Secondary | ICD-10-CM | POA: Insufficient documentation

## 2015-10-21 DIAGNOSIS — Z888 Allergy status to other drugs, medicaments and biological substances status: Secondary | ICD-10-CM | POA: Diagnosis not present

## 2015-10-21 DIAGNOSIS — E785 Hyperlipidemia, unspecified: Secondary | ICD-10-CM | POA: Diagnosis not present

## 2015-10-21 DIAGNOSIS — R2231 Localized swelling, mass and lump, right upper limb: Secondary | ICD-10-CM | POA: Insufficient documentation

## 2015-10-21 DIAGNOSIS — D2111 Benign neoplasm of connective and other soft tissue of right upper limb, including shoulder: Secondary | ICD-10-CM | POA: Diagnosis not present

## 2015-10-21 DIAGNOSIS — Z8042 Family history of malignant neoplasm of prostate: Secondary | ICD-10-CM | POA: Insufficient documentation

## 2015-10-21 DIAGNOSIS — Z803 Family history of malignant neoplasm of breast: Secondary | ICD-10-CM | POA: Diagnosis not present

## 2015-10-21 DIAGNOSIS — E1121 Type 2 diabetes mellitus with diabetic nephropathy: Secondary | ICD-10-CM | POA: Insufficient documentation

## 2015-10-21 HISTORY — DX: Headache, unspecified: R51.9

## 2015-10-21 HISTORY — DX: Legal blindness, as defined in USA: H54.8

## 2015-10-21 HISTORY — PX: TRIGGER FINGER RELEASE: SHX641

## 2015-10-21 HISTORY — DX: Personal history of other specified conditions: Z87.898

## 2015-10-21 HISTORY — DX: Essential (primary) hypertension: I10

## 2015-10-21 HISTORY — DX: Transient cerebral ischemic attack, unspecified: G45.9

## 2015-10-21 HISTORY — PX: MASS EXCISION: SHX2000

## 2015-10-21 HISTORY — DX: Headache: R51

## 2015-10-21 SURGERY — RELEASE, A1 PULLEY, FOR TRIGGER FINGER
Anesthesia: Monitor Anesthesia Care | Site: Finger | Laterality: Right | Wound class: Clean

## 2015-10-21 MED ORDER — ONDANSETRON HCL 4 MG/2ML IJ SOLN
INTRAMUSCULAR | Status: DC | PRN
  Administered 2015-10-21: 4 mg via INTRAVENOUS

## 2015-10-21 MED ORDER — LACTATED RINGERS IV SOLN
INTRAVENOUS | Status: DC
  Administered 2015-10-21: 12:00:00 via INTRAVENOUS

## 2015-10-21 MED ORDER — MIDAZOLAM HCL 2 MG/2ML IJ SOLN
INTRAMUSCULAR | Status: DC | PRN
  Administered 2015-10-21: 2 mg via INTRAVENOUS

## 2015-10-21 MED ORDER — ONDANSETRON HCL 4 MG/2ML IJ SOLN
4.0000 mg | Freq: Once | INTRAMUSCULAR | Status: DC | PRN
Start: 2015-10-21 — End: 2015-10-21

## 2015-10-21 MED ORDER — MEPERIDINE HCL 25 MG/ML IJ SOLN: 6.2500 mg | INTRAMUSCULAR | Status: DC | PRN

## 2015-10-21 MED ORDER — PROPOFOL 500 MG/50ML IV EMUL
INTRAVENOUS | Status: DC | PRN
  Administered 2015-10-21: 75 ug/kg/min via INTRAVENOUS

## 2015-10-21 MED ORDER — BUPIVACAINE HCL (PF) 0.5 % IJ SOLN
INTRAMUSCULAR | Status: DC | PRN
  Administered 2015-10-21: 10 mL

## 2015-10-21 MED ORDER — ROPIVACAINE HCL 5 MG/ML IJ SOLN
INTRAMUSCULAR | Status: DC | PRN
  Administered 2015-10-21: 200 mg via EPIDURAL

## 2015-10-21 MED ORDER — DEXAMETHASONE SODIUM PHOSPHATE 4 MG/ML IJ SOLN
INTRAMUSCULAR | Status: DC | PRN
  Administered 2015-10-21: 4 mg via INTRAVENOUS

## 2015-10-21 MED ORDER — TRAMADOL HCL 50 MG PO TABS: 50.0000 mg | ORAL_TABLET | Freq: Four times a day (QID) | ORAL | 0 refills | Status: DC | PRN

## 2015-10-21 MED ORDER — CEFAZOLIN SODIUM-DEXTROSE 2-4 GM/100ML-% IV SOLN
2.0000 g | Freq: Once | INTRAVENOUS | Status: AC
  Administered 2015-10-21: 2 g via INTRAVENOUS

## 2015-10-21 MED ORDER — OXYCODONE HCL 5 MG PO TABS: 5.0000 mg | ORAL_TABLET | Freq: Once | ORAL | Status: DC | PRN

## 2015-10-21 MED ORDER — FENTANYL CITRATE (PF) 100 MCG/2ML IJ SOLN
INTRAMUSCULAR | Status: DC | PRN
  Administered 2015-10-21: 50 ug via INTRAVENOUS

## 2015-10-21 MED ORDER — OXYCODONE HCL 5 MG/5ML PO SOLN: 5.0000 mg | Freq: Once | ORAL | Status: DC | PRN

## 2015-10-21 MED ORDER — FENTANYL CITRATE (PF) 100 MCG/2ML IJ SOLN
25.0000 ug | INTRAMUSCULAR | Status: DC | PRN
Start: 2015-10-21 — End: 2015-10-21

## 2015-10-21 MED ORDER — LIDOCAINE HCL (PF) 2 % IJ SOLN
INTRAMUSCULAR | Status: DC | PRN
  Administered 2015-10-21: 2 mL via INTRADERMAL

## 2015-10-21 SURGICAL SUPPLY — 42 items
BANDAGE ELASTIC 2 LF NS (GAUZE/BANDAGES/DRESSINGS) IMPLANT
BANDAGE ELASTIC 2 VELCRO NS LF (GAUZE/BANDAGES/DRESSINGS) ×4 IMPLANT
BANDAGE ELASTIC 3 VELCRO NS (GAUZE/BANDAGES/DRESSINGS) IMPLANT
BANDAGE ELASTIC 4 VELCRO NS (GAUZE/BANDAGES/DRESSINGS) IMPLANT
BANDAGE ELASTIC 6 VELCRO NS (GAUZE/BANDAGES/DRESSINGS) IMPLANT
BNDG COHESIVE 4X5 TAN STRL (GAUZE/BANDAGES/DRESSINGS) ×4 IMPLANT
BNDG ESMARK 4X12 TAN STRL LF (GAUZE/BANDAGES/DRESSINGS) ×4 IMPLANT
BNDG ESMARK 6X12 TAN STRL LF (GAUZE/BANDAGES/DRESSINGS) IMPLANT
CANISTER SUCT 1200ML W/VALVE (MISCELLANEOUS) ×4 IMPLANT
CHLORAPREP W/TINT 26ML (MISCELLANEOUS) ×4 IMPLANT
CORD BIP STRL DISP 12FT (MISCELLANEOUS) ×4 IMPLANT
COVER LIGHT HANDLE UNIVERSAL (MISCELLANEOUS) ×8 IMPLANT
CUFF TOURN SGL QUICK 18 (TOURNIQUET CUFF) ×4 IMPLANT
CUFF TOURN SGL QUICK 24 (TOURNIQUET CUFF)
CUFF TOURNIQUET DUAL PORT 18X3 (MISCELLANEOUS) ×4 IMPLANT
CUFF TRNQT CYL 24X4X40X1 (TOURNIQUET CUFF) IMPLANT
DECANTER SPIKE VIAL GLASS SM (MISCELLANEOUS) IMPLANT
DRAPE SURG 17X11 SM STRL (DRAPES) ×4 IMPLANT
GAUZE PETRO XEROFOAM 1X8 (MISCELLANEOUS) ×4 IMPLANT
GAUZE SPONGE 4X4 12PLY STRL (GAUZE/BANDAGES/DRESSINGS) ×4 IMPLANT
GLOVE BIO SURGEON STRL SZ 6.5 (GLOVE) ×6 IMPLANT
GLOVE BIO SURGEON STRL SZ8 (GLOVE) ×8 IMPLANT
GLOVE BIO SURGEONS STRL SZ 6.5 (GLOVE) ×2
GLOVE BIOGEL PI IND STRL 6.5 (GLOVE) ×4 IMPLANT
GLOVE BIOGEL PI INDICATOR 6.5 (GLOVE) ×4
GLOVE INDICATOR 8.0 STRL GRN (GLOVE) ×4 IMPLANT
GOWN STRL REUS W/ TWL LRG LVL3 (GOWN DISPOSABLE) ×4 IMPLANT
GOWN STRL REUS W/ TWL XL LVL3 (GOWN DISPOSABLE) ×2 IMPLANT
GOWN STRL REUS W/TWL LRG LVL3 (GOWN DISPOSABLE) ×4
GOWN STRL REUS W/TWL XL LVL3 (GOWN DISPOSABLE) ×2
KIT ROOM TURNOVER OR (KITS) ×4 IMPLANT
NEEDLE HYPO 21X1.5 SAFETY (NEEDLE) IMPLANT
NS IRRIG 500ML POUR BTL (IV SOLUTION) ×4 IMPLANT
PACK EXTREMITY ARMC (MISCELLANEOUS) ×4 IMPLANT
PAD GROUND ADULT SPLIT (MISCELLANEOUS) ×4 IMPLANT
STOCKINETTE IMPERVIOUS 9X36 MD (GAUZE/BANDAGES/DRESSINGS) ×4 IMPLANT
STOCKINETTE IMPERVIOUS LG (DRAPES) IMPLANT
STRAP BODY AND KNEE 60X3 (MISCELLANEOUS) ×4 IMPLANT
SUT PROLENE 4 0 PS 2 18 (SUTURE) ×4 IMPLANT
SUT VIC AB 3-0 SH 27 (SUTURE) ×2
SUT VIC AB 3-0 SH 27X BRD (SUTURE) ×2 IMPLANT
SYR 20CC LL (SYRINGE) IMPLANT

## 2015-10-21 NOTE — Anesthesia Postprocedure Evaluation (Signed)
Anesthesia Post Note  Patient: Ronald Trujillo  Procedure(s) Performed: Procedure(s) (LRB): RELEASE TRIGGER FINGER/A-1 PULLEY THUMB (Right) EXCISION OF SOFT TISSUE MASS ON THE POSTERIOR RIGHT FOREARM REGION (Right)  Patient location during evaluation: PACU Anesthesia Type: MAC and Regional Level of consciousness: awake and alert Pain management: pain level controlled Vital Signs Assessment: post-procedure vital signs reviewed and stable Respiratory status: spontaneous breathing, nonlabored ventilation, respiratory function stable and patient connected to nasal cannula oxygen Cardiovascular status: stable and blood pressure returned to baseline Anesthetic complications: no    Amaryllis Dyke

## 2015-10-21 NOTE — Anesthesia Preprocedure Evaluation (Signed)
Anesthesia Evaluation  Patient identified by MRN, date of birth, ID band Patient awake    Reviewed: Allergy & Precautions, H&P , NPO status   Airway Mallampati: II  TM Distance: >3 FB Neck ROM: full    Dental   Pulmonary    Pulmonary exam normal        Cardiovascular hypertension, Normal cardiovascular exam     Neuro/Psych  Headaches, TIA   GI/Hepatic   Endo/Other    Renal/GU      Musculoskeletal   Abdominal   Peds  Hematology   Anesthesia Other Findings   Reproductive/Obstetrics                             Anesthesia Physical Anesthesia Plan  ASA: II  Anesthesia Plan: Regional and MAC   Post-op Pain Management: GA combined w/ Regional for post-op pain   Induction:   Airway Management Planned:   Additional Equipment:   Intra-op Plan:   Post-operative Plan:   Informed Consent: I have reviewed the patients History and Physical, chart, labs and discussed the procedure including the risks, benefits and alternatives for the proposed anesthesia with the patient or authorized representative who has indicated his/her understanding and acceptance.     Plan Discussed with: CRNA  Anesthesia Plan Comments:         Anesthesia Quick Evaluation

## 2015-10-21 NOTE — H&P (Signed)
Paper H&P to be scanned into permanent record. H&P reviewed. No changes. 

## 2015-10-21 NOTE — Discharge Instructions (Signed)
General Anesthesia, Adult, Care After Refer to this sheet in the next few weeks. These instructions provide you with information on caring for yourself after your procedure. Your health care provider may also give you more specific instructions. Your treatment has been planned according to current medical practices, but problems sometimes occur. Call your health care provider if you have any problems or questions after your procedure. WHAT TO EXPECT AFTER THE PROCEDURE After the procedure, it is typical to experience:  Sleepiness.  Nausea and vomiting. HOME CARE INSTRUCTIONS  For the first 24 hours after general anesthesia:  Have a responsible person with you.  Do not drive a car. If you are alone, do not take public transportation.  Do not drink alcohol.  Do not take medicine that has not been prescribed by your health care provider.  Do not sign important papers or make important decisions.  You may resume a normal diet and activities as directed by your health care provider.  Change bandages (dressings) as directed.  If you have questions or problems that seem related to general anesthesia, call the hospital and ask for the anesthetist or anesthesiologist on call. SEEK MEDICAL CARE IF:  You have nausea and vomiting that continue the day after anesthesia.  You develop a rash. SEEK IMMEDIATE MEDICAL CARE IF:   You have difficulty breathing.  You have chest pain.  You have any allergic problems.   This information is not intended to replace advice given to you by your health care provider. Make sure you discuss any questions you have with your health care provider.   Document Released: 05/30/2000 Document Revised: 03/14/2014 Document Reviewed: 06/22/2011 Elsevier Interactive Patient Education 2016 [employer].  Keep dressing dry and intact. Keep hand elevated above heart level. May shower after dressing removed on postop day 4 (Sunday). Cover sutures with Band-Aids  after drying off. Apply ice to affected area frequently. Return for follow-up in 10-14 days or as scheduled.

## 2015-10-21 NOTE — Transfer of Care (Signed)
Immediate Anesthesia Transfer of Care Note  Patient: Ronald Trujillo  Procedure(s) Performed: Procedure(s): RELEASE TRIGGER FINGER/A-1 PULLEY THUMB (Right) EXCISION OF SOFT TISSUE MASS ON THE POSTERIOR RIGHT FOREARM REGION (Right)  Patient Location: PACU  Anesthesia Type: Regional, MAC  Level of Consciousness: awake, alert  and patient cooperative  Airway and Oxygen Therapy: Patient Spontanous Breathing and Patient connected to supplemental oxygen  Post-op Assessment: Post-op Vital signs reviewed, Patient's Cardiovascular Status Stable, Respiratory Function Stable, Patent Airway and No signs of Nausea or vomiting  Post-op Vital Signs: Reviewed and stable  Complications: No apparent anesthesia complications

## 2015-10-21 NOTE — Op Note (Signed)
10/21/2015  1:25 PM  Patient:   Ronald Trujillo  Pre-Op Diagnosis:   1. Right trigger thumb.  2. Soft tissue mass volar proximal right forearm.  Post-Op Diagnosis:   Same.  Procedure:   1. Release right trigger thumb.  2. Excision soft tissue mass more proximal right forearm.  Surgeon:   Pascal Lux, MD  Assistant:   Delma Post, PA-S  Anesthesia:   IV sedation with interscalene block placed preoperatively by the anesthesiologist.  Findings:   As above. The underlying flexor tendon of the thumb was in excellent condition. The mass appeared to be a benign appearing blood-filled cystic mass.  Complications:   None  EBL:   2 cc  Fluids:   700 cc crystalloid  TT:   19 minutes at 250 mmHg  Drains:   None  Closure:   4-0 Prolene  Implants:   None  Brief Clinical Note:   The patient is a 55 year old male with a several month history of progressively worsening painful catching of his right thumb. These symptoms have progressed despite medications, activity modification, etc. The patient's history and examination were consistent with a right trigger thumb. The patient presents at this time for a right trigger thumb release. The patient also notes presence of a mildly tender soft tissue mass in the volar aspect of his proximal right forearm. He would like to have this removed at this time as well.  Procedure:   The patient underwent placement of an interscalene block in the preoperative holding area before he was brought into the operating room and lain in the supine position. After adequate IV sedation was achieved, the right hand and upper extremity were prepped with ChloraPrep solution before being draped sterilely. Preoperative antibiotics were administered. A timeout was performed to verify the appropriate surgical site before the limb was exsanguinated with an Esmarch and the tourniquet inflated to 250 mmHg. An approximately 1.5-2.0 cm incision was made over the volar aspect  of the right thumb at the level of the metacarpal head centered over the flexor sheath. The incision was carried down through the subcutaneous tissues with care taken to identify and protect the digital neurovascular structures. The flexor sheath was entered just proximal to the A1 pulley. The sheath was released proximally for several centimeters under direct visualization. Distally, a clamp was placed beneath the A1 pulley and used to release any adhesions. The clamp was repositioned so that one jaw was superficial to and the other jaw deep to the A1 pulley. The A1 pulley was incised on either side of the clamp to remove a 2 mm strip of tissue. Metzenbaum scissors was used to ensure complete release of the A1 pulley more distally. The underlying tendon was carefully inspected and found to be intact.   Attention was then directed to the small volar forearm mass. An approximate 1 separate incision was made directly over the mass. The incision was carried down through the subcutaneous tissues. The mass was identified and excised in its entirety. Hemostasis was achieved using bipolar electrocautery.   The wound  Were copiously irrigated with sterile saline solution before being closed using 4-0 Prolene interrupted sutures. A total of 10 cc of 0.5% plain Sensorcaine was injected in and around the incisions to help with postoperative analgesia before a sterile bulky dressing was applied to the hand. A sterile occlusive dressing was applied to the volar forearm incision site. The patient was then awakened and returned to the recovery room in satisfactory condition  after tolerating the procedure well.

## 2015-10-21 NOTE — Anesthesia Procedure Notes (Signed)
Anesthesia Regional Block:  Supraclavicular block  Pre-Anesthetic Checklist: ,, timeout performed, Correct Patient, Correct Site, Correct Laterality, Correct Procedure, Correct Position, site marked, Risks and benefits discussed,  Surgical consent,  Pre-op evaluation,  At surgeon's request and post-op pain management  Laterality: Right  Prep: chloraprep       Needles:  Injection technique: Single-shot  Needle Type: Echogenic Stimulator Needle      Needle Gauge: 21 and 21 G    Additional Needles:  Procedures: ultrasound guided (picture in chart) Supraclavicular block Narrative:  Start time: 10/21/2015 11:54 AM End time: 10/21/2015 12:00 PM Injection made incrementally with aspirations every 5 mL.  Performed by: Personally  Anesthesiologist: Amaryllis Dyke  Additional Notes: Functioning IV was confirmed and monitors applied.  Sterile prep and drape,hand hygiene and sterile gloves were used.Ultrasound guidance: relevant anatomy identified, needle position confirmed, local anesthetic spread visualized around nerve(s)., vascular puncture avoided.  Image printed for medical record.  Negative aspiration and negative test dose prior to incremental administration of local anesthetic. The patient tolerated the procedure well. Vitals signes recorded in RN notes.

## 2015-10-21 NOTE — Anesthesia Procedure Notes (Signed)
Procedure Name: MAC Performed by: Sevyn Paredez Pre-anesthesia Checklist: Patient identified, Emergency Drugs available, Suction available, Timeout performed and Patient being monitored Patient Re-evaluated:Patient Re-evaluated prior to inductionOxygen Delivery Method: Simple face mask Placement Confirmation: positive ETCO2       

## 2015-10-21 NOTE — Anesthesia Procedure Notes (Signed)
Procedure Name: MAC Performed by: Arynn Armand Pre-anesthesia Checklist: Patient identified, Emergency Drugs available, Suction available, Timeout performed and Patient being monitored Patient Re-evaluated:Patient Re-evaluated prior to inductionOxygen Delivery Method: Nasal cannula Placement Confirmation: positive ETCO2     

## 2015-10-23 LAB — SURGICAL PATHOLOGY

## 2015-11-02 ENCOUNTER — Other Ambulatory Visit: Payer: Self-pay | Admitting: Family Medicine

## 2015-11-02 DIAGNOSIS — D2361 Other benign neoplasm of skin of right upper limb, including shoulder: Secondary | ICD-10-CM | POA: Diagnosis not present

## 2015-11-02 DIAGNOSIS — M1712 Unilateral primary osteoarthritis, left knee: Secondary | ICD-10-CM

## 2015-11-02 DIAGNOSIS — M65311 Trigger thumb, right thumb: Secondary | ICD-10-CM | POA: Diagnosis not present

## 2015-11-16 ENCOUNTER — Ambulatory Visit
Admission: RE | Admit: 2015-11-16 | Discharge: 2015-11-16 | Disposition: A | Discharge status: Home / Self Care | Payer: PPO | Source: Ambulatory Visit | Attending: Family Medicine | Admitting: Family Medicine

## 2015-11-16 DIAGNOSIS — M25462 Effusion, left knee: Secondary | ICD-10-CM | POA: Diagnosis not present

## 2015-11-16 DIAGNOSIS — M65862 Other synovitis and tenosynovitis, left lower leg: Secondary | ICD-10-CM | POA: Diagnosis not present

## 2015-11-16 DIAGNOSIS — M25461 Effusion, right knee: Secondary | ICD-10-CM | POA: Diagnosis not present

## 2015-11-16 DIAGNOSIS — M1712 Unilateral primary osteoarthritis, left knee: Secondary | ICD-10-CM | POA: Insufficient documentation

## 2015-11-16 DIAGNOSIS — M23301 Other meniscus derangements, unspecified lateral meniscus, left knee: Secondary | ICD-10-CM | POA: Insufficient documentation

## 2015-11-30 DIAGNOSIS — M65311 Trigger thumb, right thumb: Secondary | ICD-10-CM | POA: Diagnosis not present

## 2015-12-14 DIAGNOSIS — S86912D Strain of unspecified muscle(s) and tendon(s) at lower leg level, left leg, subsequent encounter: Secondary | ICD-10-CM | POA: Diagnosis not present

## 2015-12-14 DIAGNOSIS — M23204 Derangement of unspecified medial meniscus due to old tear or injury, left knee: Secondary | ICD-10-CM | POA: Diagnosis not present

## 2015-12-28 DIAGNOSIS — Z125 Encounter for screening for malignant neoplasm of prostate: Secondary | ICD-10-CM | POA: Diagnosis not present

## 2015-12-28 DIAGNOSIS — D6489 Other specified anemias: Secondary | ICD-10-CM | POA: Diagnosis not present

## 2015-12-28 DIAGNOSIS — I10 Essential (primary) hypertension: Secondary | ICD-10-CM | POA: Diagnosis not present

## 2015-12-28 DIAGNOSIS — E1129 Type 2 diabetes mellitus with other diabetic kidney complication: Secondary | ICD-10-CM | POA: Diagnosis not present

## 2015-12-28 DIAGNOSIS — E784 Other hyperlipidemia: Secondary | ICD-10-CM | POA: Diagnosis not present

## 2015-12-28 DIAGNOSIS — R809 Proteinuria, unspecified: Secondary | ICD-10-CM | POA: Diagnosis not present

## 2016-01-04 DIAGNOSIS — Z23 Encounter for immunization: Secondary | ICD-10-CM | POA: Diagnosis not present

## 2016-01-04 DIAGNOSIS — R809 Proteinuria, unspecified: Secondary | ICD-10-CM | POA: Diagnosis not present

## 2016-01-04 DIAGNOSIS — E1129 Type 2 diabetes mellitus with other diabetic kidney complication: Secondary | ICD-10-CM | POA: Diagnosis not present

## 2016-01-04 DIAGNOSIS — E784 Other hyperlipidemia: Secondary | ICD-10-CM | POA: Diagnosis not present

## 2016-01-04 DIAGNOSIS — M1712 Unilateral primary osteoarthritis, left knee: Secondary | ICD-10-CM | POA: Diagnosis not present

## 2016-01-04 DIAGNOSIS — D649 Anemia, unspecified: Secondary | ICD-10-CM | POA: Diagnosis not present

## 2016-01-04 DIAGNOSIS — I10 Essential (primary) hypertension: Secondary | ICD-10-CM | POA: Diagnosis not present

## 2016-01-04 DIAGNOSIS — R490 Dysphonia: Secondary | ICD-10-CM | POA: Diagnosis not present

## 2016-03-01 DIAGNOSIS — M1712 Unilateral primary osteoarthritis, left knee: Secondary | ICD-10-CM | POA: Diagnosis not present

## 2016-03-01 DIAGNOSIS — E784 Other hyperlipidemia: Secondary | ICD-10-CM | POA: Diagnosis not present

## 2016-03-01 DIAGNOSIS — E1129 Type 2 diabetes mellitus with other diabetic kidney complication: Secondary | ICD-10-CM | POA: Diagnosis not present

## 2016-03-01 DIAGNOSIS — I10 Essential (primary) hypertension: Secondary | ICD-10-CM | POA: Diagnosis not present

## 2016-03-01 DIAGNOSIS — R809 Proteinuria, unspecified: Secondary | ICD-10-CM | POA: Diagnosis not present

## 2016-03-01 DIAGNOSIS — R490 Dysphonia: Secondary | ICD-10-CM | POA: Diagnosis not present

## 2016-03-01 DIAGNOSIS — D649 Anemia, unspecified: Secondary | ICD-10-CM | POA: Diagnosis not present

## 2016-03-11 DIAGNOSIS — M23204 Derangement of unspecified medial meniscus due to old tear or injury, left knee: Secondary | ICD-10-CM | POA: Diagnosis not present

## 2016-03-11 DIAGNOSIS — S86912D Strain of unspecified muscle(s) and tendon(s) at lower leg level, left leg, subsequent encounter: Secondary | ICD-10-CM | POA: Diagnosis not present

## 2016-03-29 DIAGNOSIS — M1712 Unilateral primary osteoarthritis, left knee: Secondary | ICD-10-CM | POA: Diagnosis not present

## 2016-03-29 DIAGNOSIS — D649 Anemia, unspecified: Secondary | ICD-10-CM | POA: Diagnosis not present

## 2016-03-29 DIAGNOSIS — R809 Proteinuria, unspecified: Secondary | ICD-10-CM | POA: Diagnosis not present

## 2016-03-29 DIAGNOSIS — E1129 Type 2 diabetes mellitus with other diabetic kidney complication: Secondary | ICD-10-CM | POA: Diagnosis not present

## 2016-04-05 DIAGNOSIS — E784 Other hyperlipidemia: Secondary | ICD-10-CM | POA: Diagnosis not present

## 2016-04-05 DIAGNOSIS — M1712 Unilateral primary osteoarthritis, left knee: Secondary | ICD-10-CM | POA: Diagnosis not present

## 2016-04-05 DIAGNOSIS — D649 Anemia, unspecified: Secondary | ICD-10-CM | POA: Diagnosis not present

## 2016-04-05 DIAGNOSIS — R809 Proteinuria, unspecified: Secondary | ICD-10-CM | POA: Diagnosis not present

## 2016-04-05 DIAGNOSIS — E1129 Type 2 diabetes mellitus with other diabetic kidney complication: Secondary | ICD-10-CM | POA: Diagnosis not present

## 2016-04-05 DIAGNOSIS — I10 Essential (primary) hypertension: Secondary | ICD-10-CM | POA: Diagnosis not present

## 2016-04-05 DIAGNOSIS — D72828 Other elevated white blood cell count: Secondary | ICD-10-CM | POA: Diagnosis not present

## 2016-04-05 DIAGNOSIS — R490 Dysphonia: Secondary | ICD-10-CM | POA: Diagnosis not present

## 2016-05-20 DIAGNOSIS — H47013 Ischemic optic neuropathy, bilateral: Secondary | ICD-10-CM | POA: Diagnosis not present

## 2016-05-23 DIAGNOSIS — M23204 Derangement of unspecified medial meniscus due to old tear or injury, left knee: Secondary | ICD-10-CM | POA: Diagnosis not present

## 2016-05-23 DIAGNOSIS — S86912D Strain of unspecified muscle(s) and tendon(s) at lower leg level, left leg, subsequent encounter: Secondary | ICD-10-CM | POA: Diagnosis not present

## 2016-06-01 ENCOUNTER — Encounter
Admission: RE | Disposition: A | Discharge status: Home / Self Care | Payer: Self-pay | Source: Ambulatory Visit | Attending: Surgery

## 2016-06-01 ENCOUNTER — Ambulatory Visit
Admission: RE | Admit: 2016-06-01 | Discharge: 2016-06-01 | Disposition: A | Discharge status: Home / Self Care | Payer: PPO | Source: Ambulatory Visit | Attending: Surgery | Admitting: Surgery

## 2016-06-01 ENCOUNTER — Ambulatory Visit: Payer: PPO | Admitting: Student in an Organized Health Care Education/Training Program

## 2016-06-01 DIAGNOSIS — Z7902 Long term (current) use of antithrombotics/antiplatelets: Secondary | ICD-10-CM | POA: Insufficient documentation

## 2016-06-01 DIAGNOSIS — E785 Hyperlipidemia, unspecified: Secondary | ICD-10-CM | POA: Diagnosis not present

## 2016-06-01 DIAGNOSIS — N289 Disorder of kidney and ureter, unspecified: Secondary | ICD-10-CM | POA: Diagnosis not present

## 2016-06-01 DIAGNOSIS — M1712 Unilateral primary osteoarthritis, left knee: Secondary | ICD-10-CM | POA: Diagnosis not present

## 2016-06-01 DIAGNOSIS — S83242A Other tear of medial meniscus, current injury, left knee, initial encounter: Secondary | ICD-10-CM | POA: Diagnosis not present

## 2016-06-01 DIAGNOSIS — I10 Essential (primary) hypertension: Secondary | ICD-10-CM | POA: Diagnosis not present

## 2016-06-01 DIAGNOSIS — M6752 Plica syndrome, left knee: Secondary | ICD-10-CM | POA: Diagnosis not present

## 2016-06-01 DIAGNOSIS — Z8673 Personal history of transient ischemic attack (TIA), and cerebral infarction without residual deficits: Secondary | ICD-10-CM | POA: Insufficient documentation

## 2016-06-01 DIAGNOSIS — Z79899 Other long term (current) drug therapy: Secondary | ICD-10-CM | POA: Insufficient documentation

## 2016-06-01 DIAGNOSIS — E119 Type 2 diabetes mellitus without complications: Secondary | ICD-10-CM | POA: Insufficient documentation

## 2016-06-01 DIAGNOSIS — E1129 Type 2 diabetes mellitus with other diabetic kidney complication: Secondary | ICD-10-CM | POA: Diagnosis not present

## 2016-06-01 DIAGNOSIS — M94262 Chondromalacia, left knee: Secondary | ICD-10-CM | POA: Diagnosis not present

## 2016-06-01 HISTORY — DX: Cerebral infarction, unspecified: I63.9

## 2016-06-01 HISTORY — DX: Other complications of anesthesia, initial encounter: T88.59XA

## 2016-06-01 HISTORY — DX: Adverse effect of unspecified anesthetic, initial encounter: T41.45XA

## 2016-06-01 HISTORY — PX: KNEE ARTHROSCOPY: SHX127

## 2016-06-01 HISTORY — PX: CHONDROPLASTY: SHX5177

## 2016-06-01 HISTORY — PX: OTHER SURGICAL HISTORY: SHX169

## 2016-06-01 HISTORY — DX: Unspecified osteoarthritis, unspecified site: M19.90

## 2016-06-01 SURGERY — ARTHROSCOPY, KNEE
Anesthesia: General | Site: Knee | Laterality: Left | Wound class: Clean

## 2016-06-01 MED ORDER — PROPOFOL 10 MG/ML IV BOLUS
INTRAVENOUS | Status: DC | PRN
  Administered 2016-06-01: 200 mg via INTRAVENOUS

## 2016-06-01 MED ORDER — TRAMADOL HCL 50 MG PO TABS: 50.0000 mg | ORAL_TABLET | Freq: Four times a day (QID) | ORAL | 0 refills | Status: DC | PRN

## 2016-06-01 MED ORDER — LIDOCAINE HCL (CARDIAC) 20 MG/ML IV SOLN
INTRAVENOUS | Status: DC | PRN
  Administered 2016-06-01: 50 mg via INTRATRACHEAL

## 2016-06-01 MED ORDER — DEXAMETHASONE SODIUM PHOSPHATE 4 MG/ML IJ SOLN
INTRAMUSCULAR | Status: DC | PRN
  Administered 2016-06-01: 8 mg via INTRAVENOUS

## 2016-06-01 MED ORDER — LIDOCAINE HCL (PF) 1 % IJ SOLN: INTRAMUSCULAR | Status: DC | PRN

## 2016-06-01 MED ORDER — DEXTROSE 5 % IV SOLN
2000.0000 mg | Freq: Once | INTRAVENOUS | Status: AC
  Administered 2016-06-01: 2000 mg via INTRAVENOUS

## 2016-06-01 MED ORDER — MIDAZOLAM HCL 5 MG/5ML IJ SOLN
INTRAMUSCULAR | Status: DC | PRN
  Administered 2016-06-01: 2 mg via INTRAVENOUS

## 2016-06-01 MED ORDER — FENTANYL CITRATE (PF) 100 MCG/2ML IJ SOLN
INTRAMUSCULAR | Status: DC | PRN
  Administered 2016-06-01: 50 ug via INTRAVENOUS

## 2016-06-01 MED ORDER — BUPIVACAINE-EPINEPHRINE (PF) 0.5% -1:200000 IJ SOLN
INTRAMUSCULAR | Status: DC | PRN
  Administered 2016-06-01: 30 mL via PERINEURAL

## 2016-06-01 MED ORDER — ONDANSETRON HCL 4 MG/2ML IJ SOLN
INTRAMUSCULAR | Status: DC | PRN
  Administered 2016-06-01: 4 mg via INTRAVENOUS

## 2016-06-01 MED ORDER — LIDOCAINE HCL 1 % IJ SOLN
INTRAMUSCULAR | Status: DC | PRN
  Administered 2016-06-01: 60 mL via INTRAMUSCULAR

## 2016-06-01 MED ORDER — GLYCOPYRROLATE 0.2 MG/ML IJ SOLN
INTRAMUSCULAR | Status: DC | PRN
  Administered 2016-06-01: 0.2 mg via INTRAVENOUS

## 2016-06-01 MED ORDER — LACTATED RINGERS IV SOLN
INTRAVENOUS | Status: DC
  Administered 2016-06-01: 12:00:00 via INTRAVENOUS

## 2016-06-01 SURGICAL SUPPLY — 31 items
BANDAGE ELASTIC 6 LF NS (GAUZE/BANDAGES/DRESSINGS) ×4 IMPLANT
BLADE FULL RADIUS 3.5 (BLADE) ×4 IMPLANT
BUR ACROMIONIZER 4.0 (BURR) IMPLANT
CHLORAPREP W/TINT 26ML (MISCELLANEOUS) ×4 IMPLANT
COVER LIGHT HANDLE UNIVERSAL (MISCELLANEOUS) ×8 IMPLANT
CUFF TOURN SGL QUICK 30 (MISCELLANEOUS)
CUFF TOURN SGL QUICK 34 (TOURNIQUET CUFF)
CUFF TRNQT CYL 34X4X40X1 (TOURNIQUET CUFF) IMPLANT
CUFF TRNQT CYL LO 30X4X (MISCELLANEOUS) IMPLANT
DRAPE IMP U-DRAPE 54X76 (DRAPES) ×4 IMPLANT
GAUZE SPONGE 4X4 12PLY STRL (GAUZE/BANDAGES/DRESSINGS) ×4 IMPLANT
GLOVE BIO SURGEON STRL SZ8 (GLOVE) ×8 IMPLANT
GLOVE INDICATOR 8.0 STRL GRN (GLOVE) ×4 IMPLANT
GOWN STRL REUS W/ TWL LRG LVL3 (GOWN DISPOSABLE) ×2 IMPLANT
GOWN STRL REUS W/ TWL XL LVL3 (GOWN DISPOSABLE) ×2 IMPLANT
GOWN STRL REUS W/TWL LRG LVL3 (GOWN DISPOSABLE) ×2
GOWN STRL REUS W/TWL XL LVL3 (GOWN DISPOSABLE) ×2
IV LACTATED RINGER IRRG 3000ML (IV SOLUTION) ×4
IV LR IRRIG 3000ML ARTHROMATIC (IV SOLUTION) ×4 IMPLANT
KIT ROOM TURNOVER OR (KITS) ×4 IMPLANT
MANIFOLD 4PT FOR NEPTUNE1 (MISCELLANEOUS) ×4 IMPLANT
NEEDLE HYPO 21X1.5 SAFETY (NEEDLE) ×8 IMPLANT
PACK ARTHROSCOPY KNEE (MISCELLANEOUS) ×4 IMPLANT
STRAP BODY AND KNEE 60X3 (MISCELLANEOUS) ×4 IMPLANT
SUT PROLENE 4 0 PS 2 18 (SUTURE) ×4 IMPLANT
SUT VIC AB 2-0 CT1 27 (SUTURE)
SUT VIC AB 2-0 CT1 TAPERPNT 27 (SUTURE) IMPLANT
SYR 50ML LL SCALE MARK (SYRINGE) ×4 IMPLANT
TUBING ARTHRO INFLOW-ONLY STRL (TUBING) ×4 IMPLANT
WAND 30 DEG SABER W/CORD (SURGICAL WAND) IMPLANT
WAND HAND CNTRL MULTIVAC 90 (MISCELLANEOUS) IMPLANT

## 2016-06-01 NOTE — Anesthesia Postprocedure Evaluation (Signed)
Anesthesia Post Note  Patient: Ronald Trujillo  Procedure(s) Performed: Procedure(s) (LRB): ARTHROSCOPY KNEE WITH DEBRIDEMENT (Left) CHONDROPLASTY  Patient location during evaluation: PACU Anesthesia Type: General Level of consciousness: awake and alert Pain management: pain level controlled Vital Signs Assessment: post-procedure vital signs reviewed and stable Respiratory status: spontaneous breathing, nonlabored ventilation, respiratory function stable and patient connected to nasal cannula oxygen Cardiovascular status: blood pressure returned to baseline and stable Postop Assessment: no signs of nausea or vomiting Anesthetic complications: no    Marshell Levan

## 2016-06-01 NOTE — Op Note (Signed)
06/01/2016  1:25 PM  Patient:   Ronald Trujillo  Pre-Op Diagnosis:   Degenerative joint disease with possible medial meniscus tear, left knee.  Postoperative diagnosis:   Degenerative joint disease with symptomatic suprapatellar plica, left knee.  Procedure:   Arthroscopic debridement with excision of symptomatic plica and abrasion chondroplasty of focal grade 3 chondromalacia of medial femoral condyle, left knee.  Surgeon:   Pascal Lux, M.D.  Anesthesia:   General LMA.  Findings:   As above. There was an area of grade 3 chondromalacia involving the central weightbearing portion of the medial femoral condyle measuring approximately 1 x 1.5 cm. There also was a second area of grade 3 focal chondromalacia measuring approximately 5 x 7 mm along the medial edge of the anterior portion of the medial femoral condyle, consistent with a "kissing lesion". The medial and lateral menisci both were intact to probing, as were the anterior and posterior cruciate ligaments. Grade 2-3 chondromalacia was noted involving the central ridge of the patella, while there were grade 2 chondromalacial changes involving the lateral tibial plateau.  Complications:   None.  EBL:   <5 cc.  Total fluids:   600 cc of crystalloid.  Tourniquet time:   None  Drains:   None  Closure:   4-0 Prolene interrupted sutures.  Brief clinical note:   The patient is a 56 year old male with a 1+ year history of left knee pain. His symptoms have persisted despite medications, activity modification, injections, etc. His history and examination are suspicious for early degenerative joint disease with a possible medial meniscus tear. An MRI scan of the left knee was inconclusive for any meniscal or ligamentous pathology. The patient presents at this time for arthroscopy, debridement, and a possible partial medial meniscectomy.  Procedure:   The patient was brought into the operating room and lain in the supine position. After  adequate general laryngeal mask anesthesia was obtained, a timeout was performed to verify the appropriate side. The patient's left knee was injected sterilely using a solution of 30 cc of 1% lidocaine and 30 cc of 0.5% Sensorcaine with epinephrine. The left lower extremity was prepped with ChloraPrep solution before being draped sterilely. Preoperative antibiotics were administered. The expected portal sites were injected with 0.5% Sensorcaine with epinephrine before the camera was placed in the anterolateral portal and instrumentation performed through the anteromedial portal. The knee was sequentially examined beginning in the suprapatellar pouch, then progressing to the patellofemoral space, the medial gutter compartment, the notch, and finally the lateral compartment and gutter. The findings were as described above. Abundant reactive synovial tissues anteriorly were debrided using the full-radius resector in order to improve visualization. The medial and lateral menisci were carefully probed with the findings as described above. The areas of grade 3 chondromalacia involving the 2 different portions of the medial meniscus each were debrided back to stable margins using the full-radius resector. Patella tracking was carefully assessed. There was not any indication for a lateral release so this procedure was not performed. The instruments were removed from the joint after suctioning the excess fluid. The portal sites were closed using 4-0 Prolene interrupted sutures before a sterile bulky dressing was applied to the knee. The patient was then awakened, extubated, and returned to the recovery room in satisfactory condition after tolerating the procedure well.

## 2016-06-01 NOTE — Discharge Instructions (Signed)
General Anesthesia, Adult, Care After These instructions provide you with information about caring for yourself after your procedure. Your health care provider may also give you more specific instructions. Your treatment has been planned according to current medical practices, but problems sometimes occur. Call your health care provider if you have any problems or questions after your procedure. What can I expect after the procedure? After the procedure, it is common to have:  Vomiting.  A sore throat.  Mental slowness. It is common to feel:  Nauseous.  Cold or shivery.  Sleepy.  Tired.  Sore or achy, even in parts of your body where you did not have surgery. Follow these instructions at home: For at least 24 hours after the procedure:   Do not:  Participate in activities where you could fall or become injured.  Drive.  Use heavy machinery.  Drink alcohol.  Take sleeping pills or medicines that cause drowsiness.  Make important decisions or sign legal documents.  Take care of children on your own.  Rest. Eating and drinking   If you vomit, drink water, juice, or soup when you can drink without vomiting.  Drink enough fluid to keep your urine clear or pale yellow.  Make sure you have little or no nausea before eating solid foods.  Follow the diet recommended by your health care provider. General instructions   Have a responsible adult stay with you until you are awake and alert.  Return to your normal activities as told by your health care provider. Ask your health care provider what activities are safe for you.  Take over-the-counter and prescription medicines only as told by your health care provider.  If you smoke, do not smoke without supervision.  Keep all follow-up visits as told by your health care provider. This is important. Contact a health care provider if:  You continue to have nausea or vomiting at home, and medicines are not helpful.  You  cannot drink fluids or start eating again.  You cannot urinate after 8-12 hours.  You develop a skin rash.  You have fever.  You have increasing redness at the site of your procedure. Get help right away if:  You have difficulty breathing.  You have chest pain.  You have unexpected bleeding.  You feel that you are having a life-threatening or urgent problem. This information is not intended to replace advice given to you by your health care provider. Make sure you discuss any questions you have with your health care provider. Document Released: 05/30/2000 Document Revised: 07/27/2015 Document Reviewed: 02/05/2015 Elsevier Interactive Patient Education  2017 Cutter dressing dry and intact.  May shower after dressing changed on post-op day #4 (Sunday).  Cover sutures with Band-Aids after drying off. Apply ice frequently to knee. Take Aleve 2 tablets BID with meals for 7-10 days, then as necessary. Take pain medication as prescribed or ES Tylenol when needed.  May weight-bear as tolerated - use crutches or walker as needed. Follow-up in 10-14 days or as scheduled.

## 2016-06-01 NOTE — Transfer of Care (Signed)
Immediate Anesthesia Transfer of Care Note  Patient: Ronald Trujillo  Procedure(s) Performed: Procedure(s): ARTHROSCOPY KNEE WITH DEBRIDEMENT (Left)  Patient Location: PACU  Anesthesia Type: General  Level of Consciousness: awake, alert  and patient cooperative  Airway and Oxygen Therapy: Patient Spontanous Breathing and Patient connected to supplemental oxygen  Post-op Assessment: Post-op Vital signs reviewed, Patient's Cardiovascular Status Stable, Respiratory Function Stable, Patent Airway and No signs of Nausea or vomiting  Post-op Vital Signs: Reviewed and stable  Complications: No apparent anesthesia complications

## 2016-06-01 NOTE — Anesthesia Procedure Notes (Addendum)
Procedure Name: LMA Insertion Date/Time: 06/01/2016 12:42 PM Performed by: Londell Moh Pre-anesthesia Checklist: Patient identified, Emergency Drugs available, Suction available, Timeout performed and Patient being monitored Patient Re-evaluated:Patient Re-evaluated prior to inductionOxygen Delivery Method: Circle system utilized Preoxygenation: Pre-oxygenation with 100% oxygen Intubation Type: IV induction LMA: LMA inserted LMA Size: 4.0 Number of attempts: 1 Placement Confirmation: positive ETCO2 and breath sounds checked- equal and bilateral Tube secured with: Tape

## 2016-06-01 NOTE — H&P (Signed)
Paper H&P to be scanned into permanent record. H&P reviewed and patient re-examined. No changes. 

## 2016-06-01 NOTE — Anesthesia Preprocedure Evaluation (Signed)
Anesthesia Evaluation  Patient identified by MRN, date of birth, ID band Patient awake    Reviewed: Allergy & Precautions, H&P , NPO status   Airway Mallampati: II  TM Distance: >3 FB Neck ROM: full    Dental   Pulmonary    Pulmonary exam normal        Cardiovascular hypertension, Normal cardiovascular exam     Neuro/Psych  Headaches, TIA   GI/Hepatic   Endo/Other    Renal/GU      Musculoskeletal   Abdominal   Peds  Hematology   Anesthesia Other Findings   Reproductive/Obstetrics                             Anesthesia Physical  Anesthesia Plan  ASA: II  Anesthesia Plan: General   Post-op Pain Management:    Induction:   Airway Management Planned: LMA  Additional Equipment:   Intra-op Plan:   Post-operative Plan:   Informed Consent: I have reviewed the patients History and Physical, chart, labs and discussed the procedure including the risks, benefits and alternatives for the proposed anesthesia with the patient or authorized representative who has indicated his/her understanding and acceptance.     Plan Discussed with: CRNA  Anesthesia Plan Comments:         Anesthesia Quick Evaluation

## 2016-06-02 ENCOUNTER — Encounter: Payer: Self-pay | Admitting: Surgery

## 2016-06-07 DIAGNOSIS — D225 Melanocytic nevi of trunk: Secondary | ICD-10-CM | POA: Diagnosis not present

## 2016-06-07 DIAGNOSIS — D2339 Other benign neoplasm of skin of other parts of face: Secondary | ICD-10-CM | POA: Diagnosis not present

## 2016-06-07 DIAGNOSIS — D2262 Melanocytic nevi of left upper limb, including shoulder: Secondary | ICD-10-CM | POA: Diagnosis not present

## 2016-06-07 DIAGNOSIS — L718 Other rosacea: Secondary | ICD-10-CM | POA: Diagnosis not present

## 2016-06-07 DIAGNOSIS — D485 Neoplasm of uncertain behavior of skin: Secondary | ICD-10-CM | POA: Diagnosis not present

## 2016-06-07 DIAGNOSIS — D2261 Melanocytic nevi of right upper limb, including shoulder: Secondary | ICD-10-CM | POA: Diagnosis not present

## 2016-06-17 DIAGNOSIS — I10 Essential (primary) hypertension: Secondary | ICD-10-CM | POA: Diagnosis not present

## 2016-06-17 DIAGNOSIS — R42 Dizziness and giddiness: Secondary | ICD-10-CM | POA: Diagnosis not present

## 2016-06-17 DIAGNOSIS — E1129 Type 2 diabetes mellitus with other diabetic kidney complication: Secondary | ICD-10-CM | POA: Diagnosis not present

## 2016-06-17 DIAGNOSIS — R809 Proteinuria, unspecified: Secondary | ICD-10-CM | POA: Diagnosis not present

## 2016-07-21 DIAGNOSIS — I10 Essential (primary) hypertension: Secondary | ICD-10-CM | POA: Diagnosis not present

## 2016-07-21 DIAGNOSIS — E1129 Type 2 diabetes mellitus with other diabetic kidney complication: Secondary | ICD-10-CM | POA: Diagnosis not present

## 2016-07-21 DIAGNOSIS — M542 Cervicalgia: Secondary | ICD-10-CM | POA: Diagnosis not present

## 2016-07-21 DIAGNOSIS — M1712 Unilateral primary osteoarthritis, left knee: Secondary | ICD-10-CM | POA: Diagnosis not present

## 2016-07-21 DIAGNOSIS — R809 Proteinuria, unspecified: Secondary | ICD-10-CM | POA: Diagnosis not present

## 2016-09-20 DIAGNOSIS — D2339 Other benign neoplasm of skin of other parts of face: Secondary | ICD-10-CM | POA: Diagnosis not present

## 2016-09-20 DIAGNOSIS — D235 Other benign neoplasm of skin of trunk: Secondary | ICD-10-CM | POA: Diagnosis not present

## 2016-09-20 DIAGNOSIS — D234 Other benign neoplasm of skin of scalp and neck: Secondary | ICD-10-CM | POA: Diagnosis not present

## 2016-09-23 DIAGNOSIS — M65331 Trigger finger, right middle finger: Secondary | ICD-10-CM | POA: Diagnosis not present

## 2016-09-27 DIAGNOSIS — D649 Anemia, unspecified: Secondary | ICD-10-CM | POA: Diagnosis not present

## 2016-09-27 DIAGNOSIS — I10 Essential (primary) hypertension: Secondary | ICD-10-CM | POA: Diagnosis not present

## 2016-09-27 DIAGNOSIS — R809 Proteinuria, unspecified: Secondary | ICD-10-CM | POA: Diagnosis not present

## 2016-09-27 DIAGNOSIS — M1712 Unilateral primary osteoarthritis, left knee: Secondary | ICD-10-CM | POA: Diagnosis not present

## 2016-09-27 DIAGNOSIS — E1129 Type 2 diabetes mellitus with other diabetic kidney complication: Secondary | ICD-10-CM | POA: Diagnosis not present

## 2016-10-04 DIAGNOSIS — E1129 Type 2 diabetes mellitus with other diabetic kidney complication: Secondary | ICD-10-CM | POA: Diagnosis not present

## 2016-10-04 DIAGNOSIS — I10 Essential (primary) hypertension: Secondary | ICD-10-CM | POA: Diagnosis not present

## 2016-10-04 DIAGNOSIS — R490 Dysphonia: Secondary | ICD-10-CM | POA: Diagnosis not present

## 2016-10-04 DIAGNOSIS — R809 Proteinuria, unspecified: Secondary | ICD-10-CM | POA: Diagnosis not present

## 2016-10-04 DIAGNOSIS — K6289 Other specified diseases of anus and rectum: Secondary | ICD-10-CM | POA: Diagnosis not present

## 2016-10-04 DIAGNOSIS — D649 Anemia, unspecified: Secondary | ICD-10-CM | POA: Diagnosis not present

## 2016-10-04 DIAGNOSIS — M1712 Unilateral primary osteoarthritis, left knee: Secondary | ICD-10-CM | POA: Diagnosis not present

## 2016-10-04 DIAGNOSIS — E784 Other hyperlipidemia: Secondary | ICD-10-CM | POA: Diagnosis not present

## 2016-10-04 DIAGNOSIS — Z125 Encounter for screening for malignant neoplasm of prostate: Secondary | ICD-10-CM | POA: Diagnosis not present

## 2016-10-04 DIAGNOSIS — Z1211 Encounter for screening for malignant neoplasm of colon: Secondary | ICD-10-CM | POA: Diagnosis not present

## 2016-11-02 DIAGNOSIS — K648 Other hemorrhoids: Secondary | ICD-10-CM | POA: Diagnosis not present

## 2016-11-02 DIAGNOSIS — M546 Pain in thoracic spine: Secondary | ICD-10-CM | POA: Diagnosis not present

## 2016-11-02 DIAGNOSIS — L989 Disorder of the skin and subcutaneous tissue, unspecified: Secondary | ICD-10-CM | POA: Diagnosis not present

## 2016-11-02 DIAGNOSIS — K644 Residual hemorrhoidal skin tags: Secondary | ICD-10-CM | POA: Diagnosis not present

## 2016-11-08 DIAGNOSIS — H47013 Ischemic optic neuropathy, bilateral: Secondary | ICD-10-CM | POA: Diagnosis not present

## 2016-11-14 DIAGNOSIS — D045 Carcinoma in situ of skin of trunk: Secondary | ICD-10-CM | POA: Diagnosis not present

## 2016-11-14 DIAGNOSIS — L989 Disorder of the skin and subcutaneous tissue, unspecified: Secondary | ICD-10-CM | POA: Diagnosis not present

## 2016-11-14 DIAGNOSIS — D235 Other benign neoplasm of skin of trunk: Secondary | ICD-10-CM | POA: Diagnosis not present

## 2016-11-29 ENCOUNTER — Encounter: Payer: Self-pay | Admitting: *Deleted

## 2016-12-05 DIAGNOSIS — D099 Carcinoma in situ, unspecified: Secondary | ICD-10-CM | POA: Diagnosis not present

## 2016-12-07 ENCOUNTER — Ambulatory Visit: Payer: PPO | Admitting: Anesthesiology

## 2016-12-07 ENCOUNTER — Encounter
Admission: RE | Disposition: A | Discharge status: Home / Self Care | Payer: Self-pay | Source: Ambulatory Visit | Attending: Surgery

## 2016-12-07 ENCOUNTER — Ambulatory Visit
Admission: RE | Admit: 2016-12-07 | Discharge: 2016-12-07 | Disposition: A | Discharge status: Home / Self Care | Payer: PPO | Source: Ambulatory Visit | Attending: Surgery | Admitting: Surgery

## 2016-12-07 DIAGNOSIS — M79641 Pain in right hand: Secondary | ICD-10-CM | POA: Diagnosis not present

## 2016-12-07 DIAGNOSIS — Z79899 Other long term (current) drug therapy: Secondary | ICD-10-CM | POA: Diagnosis not present

## 2016-12-07 DIAGNOSIS — E119 Type 2 diabetes mellitus without complications: Secondary | ICD-10-CM | POA: Insufficient documentation

## 2016-12-07 DIAGNOSIS — G8918 Other acute postprocedural pain: Secondary | ICD-10-CM | POA: Diagnosis not present

## 2016-12-07 DIAGNOSIS — N529 Male erectile dysfunction, unspecified: Secondary | ICD-10-CM | POA: Insufficient documentation

## 2016-12-07 DIAGNOSIS — I10 Essential (primary) hypertension: Secondary | ICD-10-CM | POA: Insufficient documentation

## 2016-12-07 DIAGNOSIS — M65331 Trigger finger, right middle finger: Secondary | ICD-10-CM | POA: Insufficient documentation

## 2016-12-07 HISTORY — DX: Hyperlipidemia, unspecified: E78.5

## 2016-12-07 HISTORY — PX: TRIGGER FINGER RELEASE: SHX641

## 2016-12-07 SURGERY — RELEASE, A1 PULLEY, FOR TRIGGER FINGER
Anesthesia: Regional | Laterality: Right

## 2016-12-07 MED ORDER — ONDANSETRON HCL 4 MG/2ML IJ SOLN: 4.0000 mg | Freq: Once | INTRAMUSCULAR | Status: DC | PRN

## 2016-12-07 MED ORDER — OXYCODONE HCL 5 MG PO TABS: 5.0000 mg | ORAL_TABLET | Freq: Once | ORAL | Status: DC | PRN

## 2016-12-07 MED ORDER — FENTANYL CITRATE (PF) 100 MCG/2ML IJ SOLN: 25.0000 ug | INTRAMUSCULAR | Status: DC | PRN

## 2016-12-07 MED ORDER — LACTATED RINGERS IV SOLN
INTRAVENOUS | Status: DC
  Administered 2016-12-07: 13:00:00 via INTRAVENOUS

## 2016-12-07 MED ORDER — METOCLOPRAMIDE HCL 5 MG PO TABS: 5.0000 mg | ORAL_TABLET | Freq: Three times a day (TID) | ORAL | Status: DC | PRN

## 2016-12-07 MED ORDER — FENTANYL CITRATE (PF) 100 MCG/2ML IJ SOLN
INTRAMUSCULAR | Status: DC | PRN
  Administered 2016-12-07: 50 ug via INTRAVENOUS

## 2016-12-07 MED ORDER — PROPOFOL 500 MG/50ML IV EMUL
INTRAVENOUS | Status: DC | PRN
  Administered 2016-12-07: 50 ug/kg/min via INTRAVENOUS

## 2016-12-07 MED ORDER — LIDOCAINE HCL (CARDIAC) 20 MG/ML IV SOLN
INTRAVENOUS | Status: DC | PRN
  Administered 2016-12-07: 50 mg via INTRAVENOUS

## 2016-12-07 MED ORDER — OXYCODONE HCL 5 MG/5ML PO SOLN: 5.0000 mg | Freq: Once | ORAL | Status: DC | PRN

## 2016-12-07 MED ORDER — ONDANSETRON HCL 4 MG/2ML IJ SOLN: 4.0000 mg | Freq: Four times a day (QID) | INTRAMUSCULAR | Status: DC | PRN

## 2016-12-07 MED ORDER — ONDANSETRON HCL 4 MG PO TABS: 4.0000 mg | ORAL_TABLET | Freq: Four times a day (QID) | ORAL | Status: DC | PRN

## 2016-12-07 MED ORDER — CEFAZOLIN SODIUM-DEXTROSE 2-4 GM/100ML-% IV SOLN
2.0000 g | Freq: Once | INTRAVENOUS | Status: AC
  Administered 2016-12-07: 2 g via INTRAVENOUS

## 2016-12-07 MED ORDER — MIDAZOLAM HCL 2 MG/2ML IJ SOLN
INTRAMUSCULAR | Status: DC | PRN
  Administered 2016-12-07: 2 mg via INTRAVENOUS

## 2016-12-07 MED ORDER — METOCLOPRAMIDE HCL 5 MG/ML IJ SOLN: 5.0000 mg | Freq: Three times a day (TID) | INTRAMUSCULAR | Status: DC | PRN

## 2016-12-07 MED ORDER — BUPIVACAINE HCL (PF) 0.5 % IJ SOLN
INTRAMUSCULAR | Status: DC | PRN
  Administered 2016-12-07: 5 mL

## 2016-12-07 MED ORDER — TRAMADOL HCL 50 MG PO TABS: 50.0000 mg | ORAL_TABLET | Freq: Four times a day (QID) | ORAL | 0 refills | Status: AC | PRN

## 2016-12-07 SURGICAL SUPPLY — 26 items
BANDAGE ACE 4X5 VEL STRL LF (GAUZE/BANDAGES/DRESSINGS) ×3 IMPLANT
BANDAGE ELASTIC 2 LF NS (GAUZE/BANDAGES/DRESSINGS) ×3 IMPLANT
BANDAGE ELASTIC 3 LF NS (GAUZE/BANDAGES/DRESSINGS) IMPLANT
BNDG ESMARK 4X12 TAN STRL LF (GAUZE/BANDAGES/DRESSINGS) ×3 IMPLANT
CHLORAPREP W/TINT 26ML (MISCELLANEOUS) ×3 IMPLANT
CORD BIP STRL DISP 12FT (MISCELLANEOUS) ×3 IMPLANT
COVER LIGHT HANDLE UNIVERSAL (MISCELLANEOUS) ×6 IMPLANT
CUFF TOURNIQUET DUAL PORT 18X3 (MISCELLANEOUS) ×3 IMPLANT
DECANTER SPIKE VIAL GLASS SM (MISCELLANEOUS) IMPLANT
GAUZE PETRO XEROFOAM 1X8 (MISCELLANEOUS) ×3 IMPLANT
GAUZE PETROLATUM 1 X8 (GAUZE/BANDAGES/DRESSINGS) ×3 IMPLANT
GAUZE SPONGE 4X4 12PLY STRL (GAUZE/BANDAGES/DRESSINGS) ×3 IMPLANT
GLOVE BIO SURGEON STRL SZ8 (GLOVE) ×6 IMPLANT
GLOVE INDICATOR 8.0 STRL GRN (GLOVE) ×3 IMPLANT
GOWN STRL REUS W/ TWL LRG LVL3 (GOWN DISPOSABLE) ×1 IMPLANT
GOWN STRL REUS W/ TWL XL LVL3 (GOWN DISPOSABLE) ×1 IMPLANT
GOWN STRL REUS W/TWL LRG LVL3 (GOWN DISPOSABLE) ×2
GOWN STRL REUS W/TWL XL LVL3 (GOWN DISPOSABLE) ×2
KIT ROOM TURNOVER OR (KITS) ×3 IMPLANT
NS IRRIG 500ML POUR BTL (IV SOLUTION) ×3 IMPLANT
PACK EXTREMITY ARMC (MISCELLANEOUS) ×3 IMPLANT
STOCKINETTE IMPERVIOUS 9X36 MD (GAUZE/BANDAGES/DRESSINGS) ×3 IMPLANT
STRAP BODY AND KNEE 60X3 (MISCELLANEOUS) ×3 IMPLANT
SUT PROLENE 4 0 PS 2 18 (SUTURE) ×3 IMPLANT
SUT VIC AB 3-0 SH 27 (SUTURE)
SUT VIC AB 3-0 SH 27X BRD (SUTURE) IMPLANT

## 2016-12-07 NOTE — Anesthesia Procedure Notes (Signed)
Performed by: Londell Moh Pre-anesthesia Checklist: Patient identified, Emergency Drugs available, Suction available, Timeout performed and Patient being monitored Patient Re-evaluated:Patient Re-evaluated prior to induction Oxygen Delivery Method: Simple face mask Placement Confirmation: positive ETCO2

## 2016-12-07 NOTE — Anesthesia Procedure Notes (Signed)
Anesthesia Regional Block: Bier block (IV Regional)   Pre-Anesthetic Checklist: ,, timeout performed, Correct Patient, Correct Site, Correct Laterality, Correct Procedure, Correct Position, site marked, Risks and benefits discussed, Surgical consent,  Pre-op evaluation,  At surgeon's request  Procedures:,,,,, intact distal pulses, Esmarch exsanguination,, #20gu IV placed and double tourniquet utilized  Narrative:  Start time: 12/07/2016 1:55 PM End time: 12/07/2016 1:58 PM Injection made incrementally with aspirations every 40 mL.  Performed by: Personally  Anesthesiologist: BEACH, RACHEL B

## 2016-12-07 NOTE — Discharge Instructions (Signed)
General Anesthesia, Adult, Care After These instructions provide you with information about caring for yourself after your procedure. Your health care provider may also give you more specific instructions. Your treatment has been planned according to current medical practices, but problems sometimes occur. Call your health care provider if you have any problems or questions after your procedure. What can I expect after the procedure? After the procedure, it is common to have:  Vomiting.  A sore throat.  Mental slowness.  It is common to feel:  Nauseous.  Cold or shivery.  Sleepy.  Tired.  Sore or achy, even in parts of your body where you did not have surgery.  Follow these instructions at home: For at least 24 hours after the procedure:  Do not: ? Participate in activities where you could fall or become injured. ? Drive. ? Use heavy machinery. ? Drink alcohol. ? Take sleeping pills or medicines that cause drowsiness. ? Make important decisions or sign legal documents. ? Take care of children on your own.  Rest. Eating and drinking  If you vomit, drink water, juice, or soup when you can drink without vomiting.  Drink enough fluid to keep your urine clear or pale yellow.  Make sure you have little or no nausea before eating solid foods.  Follow the diet recommended by your health care provider. General instructions  Have a responsible adult stay with you until you are awake and alert.  Return to your normal activities as told by your health care provider. Ask your health care provider what activities are safe for you.  Take over-the-counter and prescription medicines only as told by your health care provider.  If you smoke, do not smoke without supervision.  Keep all follow-up visits as told by your health care provider. This is important. Contact a health care provider if:  You continue to have nausea or vomiting at home, and medicines are not helpful.  You  cannot drink fluids or start eating again.  You cannot urinate after 8-12 hours.  You develop a skin rash.  You have fever.  You have increasing redness at the site of your procedure. Get help right away if:  You have difficulty breathing.  You have chest pain.  You have unexpected bleeding.  You feel that you are having a life-threatening or urgent problem. This information is not intended to replace advice given to you by your health care provider. Make sure you discuss any questions you have with your health care provider. Document Released: 05/30/2000 Document Revised: 07/27/2015 Document Reviewed: 02/05/2015 Elsevier Interactive Patient Education  2018 [employer].  Keep dressing dry and intact. Keep hand elevated above heart level. May shower after dressing removed on postop day 4 (Sunday). Cover sutures with Band-Aids after drying off. Apply ice to affected area frequently. Take Aleve 2 tablets BID with meals for 7-10 days, then as necessary. Take pain medication as prescribed when needed.  Return for follow-up in 10-14 days or as scheduled.

## 2016-12-07 NOTE — Op Note (Signed)
12/07/2016  2:18 PM  Patient:   Ronald Trujillo  Pre-Op Diagnosis:   Right long trigger finger.  Post-Op Diagnosis:   Same  Procedure:   Release right long trigger finger.  Surgeon:   Pascal Lux, MD  Assistant:   None  Anesthesia:   Bier block  Findings:   As above.  Complications:   None  EBL:   0 cc  Fluids:   300 cc crystalloid  TT:   22 minutes at 250 mmHg  Drains:   None  Closure:   4-0 Prolene  Implants:   None  Brief Clinical Note:   The patient is a 56 year old male with a history of painful catching of his right long finger. These symptoms have progressed despite medications, activity modification, etc. The patient's history and examination were consistent with a right long trigger finger. The patient presents at this time for a right long trigger finger release.  Procedure:   The patient was brought into the operating room and lain in the supine position. After adequate IV sedation was achieved, a timeout was performed to verify the appropriate surgical site. A Bier block was placed by the anesthesiologist and the tourniquet inflated to 250 mmHg. The right hand and upper extremity were prepped with ChloraPrep solution before being draped sterilely. Preoperative antibiotics were administered. An approximately 1.5-2.0 cm incision was made over the volar aspect of the right long finger at the level of the metacarpal head centered over the flexor sheath. The incision was carried down through the subcutaneous tissues with care taken to identify and protect the digital neurovascular structures. The flexor sheath was entered just proximal to the A1 pulley. The sheath was released proximally for several centimeters under direct visualization. Distally, a clamp was placed beneath the A1 pulley and used to release any adhesions. The clamp was repositioned so that one jaw was superficial to and the other jaw deep to the A1 pulley. The A1 pulley was incised on either side of  the clamp to remove a 2 mm strip of tissue. Metzenbaum scissors were used to ensure complete release of the A1 pulley more distally. The underlying tendons were carefully inspected and found to be intact.   The wound was copiously irrigated with sterile saline solution before the wound was closed using 4-0 Prolene interrupted sutures. A total of 5 cc of 0.5% plain Sensorcaine was injected in and around the incision to help with postoperative analgesia before a sterile bulky dressing was applied to the hand. The patient was then awakened and returned to the recovery room in satisfactory condition after tolerating the procedure well.

## 2016-12-07 NOTE — Anesthesia Postprocedure Evaluation (Signed)
Anesthesia Post Note  Patient: Ronald Trujillo  Procedure(s) Performed: Nyra Jabs A RIGHT LONG TRIGGER FINGER (Right )  Patient location during evaluation: PACU Anesthesia Type: Bier Block Level of consciousness: awake and alert Pain management: pain level controlled Vital Signs Assessment: post-procedure vital signs reviewed and stable Respiratory status: spontaneous breathing, nonlabored ventilation, respiratory function stable and patient connected to nasal cannula oxygen Cardiovascular status: stable and blood pressure returned to baseline Postop Assessment: no apparent nausea or vomiting Anesthetic complications: no    Alisa Graff

## 2016-12-07 NOTE — Anesthesia Preprocedure Evaluation (Signed)
Anesthesia Evaluation  Patient identified by MRN, date of birth, ID band Patient awake    Reviewed: Allergy & Precautions, H&P , NPO status , Patient's Chart, lab work & pertinent test results, reviewed documented beta blocker date and time   History of Anesthesia Complications (+) history of anesthetic complications (hiccups post-op)  Airway Mallampati: II  TM Distance: >3 FB Neck ROM: full    Dental no notable dental hx.    Pulmonary neg pulmonary ROS,    Pulmonary exam normal breath sounds clear to auscultation       Cardiovascular Exercise Tolerance: Good hypertension,  Rhythm:regular Rate:Normal     Neuro/Psych  Headaches, TIACVA negative psych ROS   GI/Hepatic negative GI ROS, Neg liver ROS,   Endo/Other  negative endocrine ROS  Renal/GU negative Renal ROS  negative genitourinary   Musculoskeletal   Abdominal   Peds  Hematology negative hematology ROS (+)   Anesthesia Other Findings   Reproductive/Obstetrics negative OB ROS                             Anesthesia Physical Anesthesia Plan  ASA: III  Anesthesia Plan: Bier Block   Post-op Pain Management:    Induction:   PONV Risk Score and Plan:   Airway Management Planned:   Additional Equipment:   Intra-op Plan:   Post-operative Plan:   Informed Consent: I have reviewed the patients History and Physical, chart, labs and discussed the procedure including the risks, benefits and alternatives for the proposed anesthesia with the patient or authorized representative who has indicated his/her understanding and acceptance.   Dental Advisory Given  Plan Discussed with: CRNA  Anesthesia Plan Comments:         Anesthesia Quick Evaluation

## 2016-12-07 NOTE — Transfer of Care (Signed)
Immediate Anesthesia Transfer of Care Note  Patient: Ronald Trujillo  Procedure(s) Performed: Nyra Jabs A RIGHT LONG TRIGGER FINGER (Right )  Patient Location: PACU  Anesthesia Type: Bier Block  Level of Consciousness: awake, alert  and patient cooperative  Airway and Oxygen Therapy: Patient Spontanous Breathing and Patient connected to supplemental oxygen  Post-op Assessment: Post-op Vital signs reviewed, Patient's Cardiovascular Status Stable, Respiratory Function Stable, Patent Airway and No signs of Nausea or vomiting  Post-op Vital Signs: Reviewed and stable  Complications: No apparent anesthesia complications

## 2016-12-07 NOTE — H&P (Signed)
Paper H&P to be scanned into permanent record. H&P reviewed and patient re-examined. No changes. 

## 2016-12-08 ENCOUNTER — Encounter: Payer: Self-pay | Admitting: Surgery

## 2016-12-26 DIAGNOSIS — D0472 Carcinoma in situ of skin of left lower limb, including hip: Secondary | ICD-10-CM | POA: Diagnosis not present

## 2016-12-26 DIAGNOSIS — C44529 Squamous cell carcinoma of skin of other part of trunk: Secondary | ICD-10-CM | POA: Diagnosis not present

## 2016-12-26 DIAGNOSIS — C4490 Unspecified malignant neoplasm of skin, unspecified: Secondary | ICD-10-CM | POA: Diagnosis not present

## 2016-12-28 DIAGNOSIS — Z23 Encounter for immunization: Secondary | ICD-10-CM | POA: Diagnosis not present

## 2016-12-28 DIAGNOSIS — W540XXA Bitten by dog, initial encounter: Secondary | ICD-10-CM | POA: Diagnosis not present

## 2016-12-28 DIAGNOSIS — M7989 Other specified soft tissue disorders: Secondary | ICD-10-CM | POA: Diagnosis not present

## 2016-12-28 DIAGNOSIS — S41152A Open bite of left upper arm, initial encounter: Secondary | ICD-10-CM | POA: Diagnosis not present

## 2017-01-09 DIAGNOSIS — M545 Low back pain: Secondary | ICD-10-CM | POA: Diagnosis not present

## 2017-01-09 DIAGNOSIS — M25562 Pain in left knee: Secondary | ICD-10-CM | POA: Diagnosis not present

## 2017-01-09 DIAGNOSIS — S86912A Strain of unspecified muscle(s) and tendon(s) at lower leg level, left leg, initial encounter: Secondary | ICD-10-CM | POA: Diagnosis not present

## 2017-01-09 DIAGNOSIS — M25561 Pain in right knee: Secondary | ICD-10-CM | POA: Diagnosis not present

## 2017-01-09 DIAGNOSIS — S86911A Strain of unspecified muscle(s) and tendon(s) at lower leg level, right leg, initial encounter: Secondary | ICD-10-CM | POA: Diagnosis not present

## 2017-01-09 DIAGNOSIS — S39012A Strain of muscle, fascia and tendon of lower back, initial encounter: Secondary | ICD-10-CM | POA: Diagnosis not present

## 2017-01-09 DIAGNOSIS — M1712 Unilateral primary osteoarthritis, left knee: Secondary | ICD-10-CM | POA: Diagnosis not present

## 2017-01-23 DIAGNOSIS — D045 Carcinoma in situ of skin of trunk: Secondary | ICD-10-CM | POA: Diagnosis not present

## 2017-01-23 DIAGNOSIS — C4490 Unspecified malignant neoplasm of skin, unspecified: Secondary | ICD-10-CM | POA: Diagnosis not present

## 2017-01-24 DIAGNOSIS — Z1211 Encounter for screening for malignant neoplasm of colon: Secondary | ICD-10-CM | POA: Diagnosis not present

## 2017-02-09 DIAGNOSIS — R809 Proteinuria, unspecified: Secondary | ICD-10-CM | POA: Diagnosis not present

## 2017-02-09 DIAGNOSIS — E7849 Other hyperlipidemia: Secondary | ICD-10-CM | POA: Diagnosis not present

## 2017-02-09 DIAGNOSIS — D649 Anemia, unspecified: Secondary | ICD-10-CM | POA: Diagnosis not present

## 2017-02-09 DIAGNOSIS — Z125 Encounter for screening for malignant neoplasm of prostate: Secondary | ICD-10-CM | POA: Diagnosis not present

## 2017-02-09 DIAGNOSIS — E1129 Type 2 diabetes mellitus with other diabetic kidney complication: Secondary | ICD-10-CM | POA: Diagnosis not present

## 2017-02-16 DIAGNOSIS — R490 Dysphonia: Secondary | ICD-10-CM | POA: Diagnosis not present

## 2017-02-16 DIAGNOSIS — D649 Anemia, unspecified: Secondary | ICD-10-CM | POA: Diagnosis not present

## 2017-02-16 DIAGNOSIS — E1129 Type 2 diabetes mellitus with other diabetic kidney complication: Secondary | ICD-10-CM | POA: Diagnosis not present

## 2017-02-16 DIAGNOSIS — I10 Essential (primary) hypertension: Secondary | ICD-10-CM | POA: Diagnosis not present

## 2017-02-16 DIAGNOSIS — Z0000 Encounter for general adult medical examination without abnormal findings: Secondary | ICD-10-CM | POA: Diagnosis not present

## 2017-02-16 DIAGNOSIS — E7849 Other hyperlipidemia: Secondary | ICD-10-CM | POA: Diagnosis not present

## 2017-02-16 DIAGNOSIS — Z23 Encounter for immunization: Secondary | ICD-10-CM | POA: Diagnosis not present

## 2017-02-16 DIAGNOSIS — H539 Unspecified visual disturbance: Secondary | ICD-10-CM | POA: Diagnosis not present

## 2017-02-16 DIAGNOSIS — R809 Proteinuria, unspecified: Secondary | ICD-10-CM | POA: Diagnosis not present

## 2017-02-16 DIAGNOSIS — M1712 Unilateral primary osteoarthritis, left knee: Secondary | ICD-10-CM | POA: Diagnosis not present

## 2017-02-20 DIAGNOSIS — D013 Carcinoma in situ of anus and anal canal: Secondary | ICD-10-CM | POA: Diagnosis not present

## 2017-02-20 DIAGNOSIS — D049 Carcinoma in situ of skin, unspecified: Secondary | ICD-10-CM | POA: Diagnosis not present

## 2017-03-09 DIAGNOSIS — K6289 Other specified diseases of anus and rectum: Secondary | ICD-10-CM | POA: Diagnosis not present

## 2017-03-09 DIAGNOSIS — R51 Headache: Secondary | ICD-10-CM | POA: Diagnosis not present

## 2017-03-09 DIAGNOSIS — Z8673 Personal history of transient ischemic attack (TIA), and cerebral infarction without residual deficits: Secondary | ICD-10-CM | POA: Diagnosis not present

## 2017-03-09 DIAGNOSIS — K629 Disease of anus and rectum, unspecified: Secondary | ICD-10-CM | POA: Diagnosis not present

## 2017-03-09 DIAGNOSIS — I10 Essential (primary) hypertension: Secondary | ICD-10-CM | POA: Diagnosis not present

## 2017-03-09 DIAGNOSIS — D099 Carcinoma in situ, unspecified: Secondary | ICD-10-CM | POA: Diagnosis not present

## 2017-03-09 DIAGNOSIS — E669 Obesity, unspecified: Secondary | ICD-10-CM | POA: Diagnosis not present

## 2017-03-09 DIAGNOSIS — Z6832 Body mass index (BMI) 32.0-32.9, adult: Secondary | ICD-10-CM | POA: Diagnosis not present

## 2017-03-09 DIAGNOSIS — E119 Type 2 diabetes mellitus without complications: Secondary | ICD-10-CM | POA: Diagnosis not present

## 2017-03-09 DIAGNOSIS — D649 Anemia, unspecified: Secondary | ICD-10-CM | POA: Diagnosis not present

## 2017-03-09 DIAGNOSIS — D013 Carcinoma in situ of anus and anal canal: Secondary | ICD-10-CM | POA: Diagnosis not present

## 2017-03-09 DIAGNOSIS — D045 Carcinoma in situ of skin of trunk: Secondary | ICD-10-CM | POA: Diagnosis not present

## 2017-03-22 DIAGNOSIS — Z4889 Encounter for other specified surgical aftercare: Secondary | ICD-10-CM | POA: Diagnosis not present

## 2017-04-07 DIAGNOSIS — Z4889 Encounter for other specified surgical aftercare: Secondary | ICD-10-CM | POA: Diagnosis not present

## 2017-05-08 DIAGNOSIS — E119 Type 2 diabetes mellitus without complications: Secondary | ICD-10-CM | POA: Diagnosis not present

## 2017-05-12 DIAGNOSIS — E1129 Type 2 diabetes mellitus with other diabetic kidney complication: Secondary | ICD-10-CM | POA: Diagnosis not present

## 2017-05-12 DIAGNOSIS — D649 Anemia, unspecified: Secondary | ICD-10-CM | POA: Diagnosis not present

## 2017-05-12 DIAGNOSIS — R809 Proteinuria, unspecified: Secondary | ICD-10-CM | POA: Diagnosis not present

## 2017-05-19 DIAGNOSIS — M1712 Unilateral primary osteoarthritis, left knee: Secondary | ICD-10-CM | POA: Diagnosis not present

## 2017-05-19 DIAGNOSIS — I10 Essential (primary) hypertension: Secondary | ICD-10-CM | POA: Diagnosis not present

## 2017-05-19 DIAGNOSIS — R490 Dysphonia: Secondary | ICD-10-CM | POA: Diagnosis not present

## 2017-05-19 DIAGNOSIS — E7849 Other hyperlipidemia: Secondary | ICD-10-CM | POA: Diagnosis not present

## 2017-05-19 DIAGNOSIS — R809 Proteinuria, unspecified: Secondary | ICD-10-CM | POA: Diagnosis not present

## 2017-05-19 DIAGNOSIS — E1129 Type 2 diabetes mellitus with other diabetic kidney complication: Secondary | ICD-10-CM | POA: Diagnosis not present

## 2017-05-19 DIAGNOSIS — D649 Anemia, unspecified: Secondary | ICD-10-CM | POA: Diagnosis not present

## 2017-05-19 DIAGNOSIS — L989 Disorder of the skin and subcutaneous tissue, unspecified: Secondary | ICD-10-CM | POA: Diagnosis not present

## 2017-05-29 DIAGNOSIS — D2372 Other benign neoplasm of skin of left lower limb, including hip: Secondary | ICD-10-CM | POA: Diagnosis not present

## 2017-06-12 DIAGNOSIS — M19011 Primary osteoarthritis, right shoulder: Secondary | ICD-10-CM | POA: Diagnosis not present

## 2017-06-12 DIAGNOSIS — M65331 Trigger finger, right middle finger: Secondary | ICD-10-CM | POA: Diagnosis not present

## 2017-06-19 DIAGNOSIS — R85613 High grade squamous intraepithelial lesion on cytologic smear of anus (HGSIL): Secondary | ICD-10-CM | POA: Diagnosis not present

## 2017-06-30 DIAGNOSIS — D2372 Other benign neoplasm of skin of left lower limb, including hip: Secondary | ICD-10-CM | POA: Diagnosis not present

## 2017-07-04 ENCOUNTER — Encounter: Payer: Self-pay | Admitting: *Deleted

## 2017-07-05 ENCOUNTER — Ambulatory Visit: Payer: PPO | Admitting: Anesthesiology

## 2017-07-05 ENCOUNTER — Ambulatory Visit
Admission: RE | Admit: 2017-07-05 | Discharge: 2017-07-05 | Disposition: A | Discharge status: Home / Self Care | Payer: PPO | Source: Ambulatory Visit | Attending: Unknown Physician Specialty | Admitting: Unknown Physician Specialty

## 2017-07-05 ENCOUNTER — Other Ambulatory Visit: Payer: Self-pay

## 2017-07-05 ENCOUNTER — Encounter
Admission: RE | Disposition: A | Discharge status: Home / Self Care | Payer: Self-pay | Source: Ambulatory Visit | Attending: Unknown Physician Specialty

## 2017-07-05 DIAGNOSIS — Z833 Family history of diabetes mellitus: Secondary | ICD-10-CM | POA: Diagnosis not present

## 2017-07-05 DIAGNOSIS — D649 Anemia, unspecified: Secondary | ICD-10-CM | POA: Diagnosis not present

## 2017-07-05 DIAGNOSIS — Z1211 Encounter for screening for malignant neoplasm of colon: Secondary | ICD-10-CM | POA: Diagnosis not present

## 2017-07-05 DIAGNOSIS — K648 Other hemorrhoids: Secondary | ICD-10-CM | POA: Diagnosis not present

## 2017-07-05 DIAGNOSIS — Z79899 Other long term (current) drug therapy: Secondary | ICD-10-CM | POA: Diagnosis not present

## 2017-07-05 DIAGNOSIS — H548 Legal blindness, as defined in USA: Secondary | ICD-10-CM | POA: Insufficient documentation

## 2017-07-05 DIAGNOSIS — Z885 Allergy status to narcotic agent status: Secondary | ICD-10-CM | POA: Insufficient documentation

## 2017-07-05 DIAGNOSIS — E785 Hyperlipidemia, unspecified: Secondary | ICD-10-CM | POA: Diagnosis not present

## 2017-07-05 DIAGNOSIS — Z859 Personal history of malignant neoplasm, unspecified: Secondary | ICD-10-CM | POA: Diagnosis not present

## 2017-07-05 DIAGNOSIS — Z823 Family history of stroke: Secondary | ICD-10-CM | POA: Diagnosis not present

## 2017-07-05 DIAGNOSIS — Z8249 Family history of ischemic heart disease and other diseases of the circulatory system: Secondary | ICD-10-CM | POA: Diagnosis not present

## 2017-07-05 DIAGNOSIS — Z888 Allergy status to other drugs, medicaments and biological substances status: Secondary | ICD-10-CM | POA: Diagnosis not present

## 2017-07-05 DIAGNOSIS — M1990 Unspecified osteoarthritis, unspecified site: Secondary | ICD-10-CM | POA: Diagnosis not present

## 2017-07-05 DIAGNOSIS — I69312 Visuospatial deficit and spatial neglect following cerebral infarction: Secondary | ICD-10-CM | POA: Diagnosis not present

## 2017-07-05 DIAGNOSIS — Z8042 Family history of malignant neoplasm of prostate: Secondary | ICD-10-CM | POA: Diagnosis not present

## 2017-07-05 DIAGNOSIS — I10 Essential (primary) hypertension: Secondary | ICD-10-CM | POA: Insufficient documentation

## 2017-07-05 DIAGNOSIS — K5790 Diverticulosis of intestine, part unspecified, without perforation or abscess without bleeding: Secondary | ICD-10-CM | POA: Diagnosis not present

## 2017-07-05 DIAGNOSIS — K5730 Diverticulosis of large intestine without perforation or abscess without bleeding: Secondary | ICD-10-CM | POA: Insufficient documentation

## 2017-07-05 DIAGNOSIS — Z803 Family history of malignant neoplasm of breast: Secondary | ICD-10-CM | POA: Diagnosis not present

## 2017-07-05 DIAGNOSIS — G43909 Migraine, unspecified, not intractable, without status migrainosus: Secondary | ICD-10-CM | POA: Insufficient documentation

## 2017-07-05 DIAGNOSIS — Z886 Allergy status to analgesic agent status: Secondary | ICD-10-CM | POA: Insufficient documentation

## 2017-07-05 HISTORY — DX: Migraine, unspecified, not intractable, without status migrainosus: G43.909

## 2017-07-05 HISTORY — DX: Unspecified visual loss: H54.7

## 2017-07-05 HISTORY — DX: Family history of other specified conditions: Z84.89

## 2017-07-05 HISTORY — DX: Anemia, unspecified: D64.9

## 2017-07-05 HISTORY — DX: Type 2 diabetes mellitus without complications: E11.9

## 2017-07-05 HISTORY — PX: COLONOSCOPY WITH PROPOFOL: SHX5780

## 2017-07-05 HISTORY — DX: Carcinoma in situ, unspecified: D09.9

## 2017-07-05 HISTORY — DX: Male erectile dysfunction, unspecified: N52.9

## 2017-07-05 SURGERY — COLONOSCOPY WITH PROPOFOL
Anesthesia: General

## 2017-07-05 MED ORDER — PROPOFOL 500 MG/50ML IV EMUL
INTRAVENOUS | Status: DC | PRN
  Administered 2017-07-05: 100 ug/kg/min via INTRAVENOUS

## 2017-07-05 MED ORDER — PROPOFOL 10 MG/ML IV BOLUS
INTRAVENOUS | Status: AC
  Filled 2017-07-05: qty 20

## 2017-07-05 MED ORDER — LIDOCAINE HCL (CARDIAC) PF 100 MG/5ML IV SOSY
PREFILLED_SYRINGE | INTRAVENOUS | Status: DC | PRN
  Administered 2017-07-05: 60 mg via INTRAVENOUS

## 2017-07-05 MED ORDER — PROPOFOL 10 MG/ML IV BOLUS
INTRAVENOUS | Status: DC | PRN
  Administered 2017-07-05: 20 mg via INTRAVENOUS
  Administered 2017-07-05: 80 mg via INTRAVENOUS
  Administered 2017-07-05: 50 mg via INTRAVENOUS

## 2017-07-05 MED ORDER — SODIUM CHLORIDE 0.9 % IV SOLN: INTRAVENOUS | Status: DC

## 2017-07-05 MED ORDER — PROPOFOL 500 MG/50ML IV EMUL: INTRAVENOUS | Status: DC | PRN

## 2017-07-05 MED ORDER — SODIUM CHLORIDE 0.9 % IV SOLN
INTRAVENOUS | Status: DC
  Administered 2017-07-05 (×2): via INTRAVENOUS

## 2017-07-05 NOTE — Op Note (Signed)
[facility] Gastroenterology Patient Name: Ronald Trujillo Procedure Date: 07/05/2017 8:10 AM MRN: [id number] Account #: [id number] Date of Birth: September 08, 1960 Admit Type: Outpatient Age: 57 Room: [facility] ENDO ROOM 4 Gender: Male Note Status: Finalized Procedure:            Colonoscopy Indications:          Screening for colorectal malignant neoplasm Providers:            Manya Silvas, MD Referring MD:         Ramonita Lab, MD (Referring MD) Medicines:            Propofol per Anesthesia Complications:        No immediate complications. Procedure:            Pre-Anesthesia Assessment:                       - After reviewing the risks and benefits, the patient                        was deemed in satisfactory condition to undergo the                        procedure.                       After obtaining informed consent, the colonoscope was                        passed under direct vision. Throughout the procedure,                        the patient's blood pressure, pulse, and oxygen                        saturations were monitored continuously. The                        Colonoscope was introduced through the anus and                        advanced to the the cecum, identified by appendiceal                        orifice and ileocecal valve. The colonoscopy was                        performed without difficulty. The patient tolerated the                        procedure well. The quality of the bowel preparation                        was good. Findings:      Cecum reached but pictures not done.      A few small-mouthed diverticula were found in the sigmoid colon and       descending colon.      Internal hemorrhoids were found during endoscopy. The hemorrhoids were       small.      The exam was otherwise without abnormality. Impression:           - Diverticulosis in the  sigmoid colon and in the                        descending colon.    - Internal hemorrhoids.                       - The examination was otherwise normal.                       - No specimens collected. Recommendation:       - Repeat colonoscopy in 10 years for screening purposes. Manya Silvas, MD 07/05/2017 8:36:32 AM This report has been signed electronically. Number of Addenda: 0 Note Initiated On: 07/05/2017 8:10 AM Scope Withdrawal Time: 0 hours 9 minutes 10 seconds  Total Procedure Duration: 0 hours 14 minutes 55 seconds       [facility]

## 2017-07-05 NOTE — Anesthesia Postprocedure Evaluation (Signed)
Anesthesia Post Note  Patient: Ronald Trujillo  Procedure(s) Performed: COLONOSCOPY WITH PROPOFOL (N/A )  Patient location during evaluation: Endoscopy Anesthesia Type: General Level of consciousness: awake and alert Pain management: pain level controlled Vital Signs Assessment: post-procedure vital signs reviewed and stable Respiratory status: spontaneous breathing and respiratory function stable Cardiovascular status: stable Anesthetic complications: no     Last Vitals:  Vitals:   07/05/17 0723 07/05/17 0830  BP: (!) 135/99 94/62  Pulse: 73 66  Resp: 18 16  Temp: 36.9 C (!) 36.1 C  SpO2: 98% 97%    Last Pain:  Vitals:   07/05/17 0723  PainSc: 6                  Bentlee Drier K

## 2017-07-05 NOTE — Anesthesia Preprocedure Evaluation (Signed)
Anesthesia Evaluation  Patient identified by MRN, date of birth, ID band Patient awake    Reviewed: Allergy & Precautions, NPO status , Patient's Chart, lab work & pertinent test results  History of Anesthesia Complications (+) Family history of anesthesia reaction  Airway Mallampati: III       Dental   Pulmonary neg sleep apnea, neg COPD,           Cardiovascular hypertension, Pt. on medications (-) Past MI and (-) CHF (-) dysrhythmias (-) Valvular Problems/Murmurs     Neuro/Psych TIACVA (vison, legally blind), Residual Symptoms    GI/Hepatic Neg liver ROS, neg GERD  ,  Endo/Other  neg diabetes  Renal/GU negative Renal ROS     Musculoskeletal   Abdominal   Peds  Hematology  (+) anemia ,   Anesthesia Other Findings   Reproductive/Obstetrics                             Anesthesia Physical Anesthesia Plan  ASA: III  Anesthesia Plan: General   Post-op Pain Management:    Induction: Intravenous  PONV Risk Score and Plan: Propofol infusion and TIVA  Airway Management Planned: Nasal Cannula  Additional Equipment:   Intra-op Plan:   Post-operative Plan:   Informed Consent: I have reviewed the patients History and Physical, chart, labs and discussed the procedure including the risks, benefits and alternatives for the proposed anesthesia with the patient or authorized representative who has indicated his/her understanding and acceptance.     Plan Discussed with:   Anesthesia Plan Comments:         Anesthesia Quick Evaluation
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
## 2017-07-05 NOTE — Anesthesia Post-op Follow-up Note (Signed)
Anesthesia QCDR form completed.        

## 2017-07-05 NOTE — Transfer of Care (Signed)
Immediate Anesthesia Transfer of Care Note  Patient: Ronald Trujillo  Procedure(s) Performed: COLONOSCOPY WITH PROPOFOL (N/A )  Patient Location: PACU  Anesthesia Type:General  Level of Consciousness: sedated  Airway & Oxygen Therapy: Patient Spontanous Breathing and Patient connected to nasal cannula oxygen  Post-op Assessment: Report given to RN and Post -op Vital signs reviewed and stable  Post vital signs: Reviewed and stable  Last Vitals:  Vitals Value Taken Time  BP    Temp    Pulse 67 07/05/2017  8:34 AM  Resp 15 07/05/2017  8:34 AM  SpO2 96 % 07/05/2017  8:34 AM  Vitals shown include unvalidated device data.  Last Pain:  Vitals:   07/05/17 0723  PainSc: 6          Complications: No apparent anesthesia complications

## 2017-07-06 ENCOUNTER — Encounter: Payer: Self-pay | Admitting: Unknown Physician Specialty

## 2017-07-17 DIAGNOSIS — S83231A Complex tear of medial meniscus, current injury, right knee, initial encounter: Secondary | ICD-10-CM | POA: Diagnosis not present

## 2017-07-17 DIAGNOSIS — S83232D Complex tear of medial meniscus, current injury, left knee, subsequent encounter: Secondary | ICD-10-CM | POA: Diagnosis not present

## 2017-07-17 DIAGNOSIS — M1712 Unilateral primary osteoarthritis, left knee: Secondary | ICD-10-CM | POA: Diagnosis not present

## 2017-07-17 DIAGNOSIS — M6752 Plica syndrome, left knee: Secondary | ICD-10-CM | POA: Diagnosis not present

## 2017-07-19 ENCOUNTER — Other Ambulatory Visit: Payer: Self-pay | Admitting: Surgery

## 2017-07-19 DIAGNOSIS — S83231A Complex tear of medial meniscus, current injury, right knee, initial encounter: Secondary | ICD-10-CM

## 2017-07-27 ENCOUNTER — Other Ambulatory Visit: Payer: Self-pay | Admitting: Surgery

## 2017-07-27 DIAGNOSIS — S83232D Complex tear of medial meniscus, current injury, left knee, subsequent encounter: Secondary | ICD-10-CM

## 2017-07-27 DIAGNOSIS — M6752 Plica syndrome, left knee: Secondary | ICD-10-CM

## 2017-07-27 DIAGNOSIS — M1712 Unilateral primary osteoarthritis, left knee: Secondary | ICD-10-CM

## 2017-08-04 ENCOUNTER — Ambulatory Visit
Admission: RE | Admit: 2017-08-04 | Discharge: 2017-08-04 | Disposition: A | Discharge status: Home / Self Care | Payer: PPO | Source: Ambulatory Visit | Attending: Surgery | Admitting: Surgery

## 2017-08-04 DIAGNOSIS — M1712 Unilateral primary osteoarthritis, left knee: Secondary | ICD-10-CM

## 2017-08-04 DIAGNOSIS — M25561 Pain in right knee: Secondary | ICD-10-CM | POA: Diagnosis not present

## 2017-08-04 DIAGNOSIS — M170 Bilateral primary osteoarthritis of knee: Secondary | ICD-10-CM | POA: Insufficient documentation

## 2017-08-04 DIAGNOSIS — M25461 Effusion, right knee: Secondary | ICD-10-CM | POA: Insufficient documentation

## 2017-08-04 DIAGNOSIS — S83232D Complex tear of medial meniscus, current injury, left knee, subsequent encounter: Secondary | ICD-10-CM

## 2017-08-04 DIAGNOSIS — S83231A Complex tear of medial meniscus, current injury, right knee, initial encounter: Secondary | ICD-10-CM

## 2017-08-04 DIAGNOSIS — M25562 Pain in left knee: Secondary | ICD-10-CM | POA: Diagnosis not present

## 2017-08-04 DIAGNOSIS — M6752 Plica syndrome, left knee: Secondary | ICD-10-CM

## 2017-08-11 DIAGNOSIS — S86911D Strain of unspecified muscle(s) and tendon(s) at lower leg level, right leg, subsequent encounter: Secondary | ICD-10-CM | POA: Diagnosis not present

## 2017-08-11 DIAGNOSIS — M1712 Unilateral primary osteoarthritis, left knee: Secondary | ICD-10-CM | POA: Diagnosis not present

## 2017-08-14 DIAGNOSIS — R809 Proteinuria, unspecified: Secondary | ICD-10-CM | POA: Diagnosis not present

## 2017-08-14 DIAGNOSIS — D649 Anemia, unspecified: Secondary | ICD-10-CM | POA: Diagnosis not present

## 2017-08-14 DIAGNOSIS — E1129 Type 2 diabetes mellitus with other diabetic kidney complication: Secondary | ICD-10-CM | POA: Diagnosis not present

## 2017-08-21 DIAGNOSIS — E1129 Type 2 diabetes mellitus with other diabetic kidney complication: Secondary | ICD-10-CM | POA: Diagnosis not present

## 2017-08-21 DIAGNOSIS — R490 Dysphonia: Secondary | ICD-10-CM | POA: Diagnosis not present

## 2017-08-21 DIAGNOSIS — M1712 Unilateral primary osteoarthritis, left knee: Secondary | ICD-10-CM | POA: Diagnosis not present

## 2017-08-21 DIAGNOSIS — Z125 Encounter for screening for malignant neoplasm of prostate: Secondary | ICD-10-CM | POA: Diagnosis not present

## 2017-08-21 DIAGNOSIS — R809 Proteinuria, unspecified: Secondary | ICD-10-CM | POA: Diagnosis not present

## 2017-08-21 DIAGNOSIS — I10 Essential (primary) hypertension: Secondary | ICD-10-CM | POA: Diagnosis not present

## 2017-08-21 DIAGNOSIS — D649 Anemia, unspecified: Secondary | ICD-10-CM | POA: Diagnosis not present

## 2017-08-21 DIAGNOSIS — E7849 Other hyperlipidemia: Secondary | ICD-10-CM | POA: Diagnosis not present

## 2017-11-21 DIAGNOSIS — I10 Essential (primary) hypertension: Secondary | ICD-10-CM | POA: Diagnosis not present

## 2017-11-21 DIAGNOSIS — E1129 Type 2 diabetes mellitus with other diabetic kidney complication: Secondary | ICD-10-CM | POA: Diagnosis not present

## 2017-11-21 DIAGNOSIS — R809 Proteinuria, unspecified: Secondary | ICD-10-CM | POA: Diagnosis not present

## 2017-11-21 DIAGNOSIS — E7849 Other hyperlipidemia: Secondary | ICD-10-CM | POA: Diagnosis not present

## 2017-12-26 DIAGNOSIS — L719 Rosacea, unspecified: Secondary | ICD-10-CM | POA: Diagnosis not present

## 2017-12-26 DIAGNOSIS — L720 Epidermal cyst: Secondary | ICD-10-CM | POA: Diagnosis not present

## 2017-12-26 DIAGNOSIS — L918 Other hypertrophic disorders of the skin: Secondary | ICD-10-CM | POA: Diagnosis not present

## 2017-12-26 DIAGNOSIS — L821 Other seborrheic keratosis: Secondary | ICD-10-CM | POA: Diagnosis not present

## 2018-02-12 DIAGNOSIS — K6282 Dysplasia of anus: Secondary | ICD-10-CM | POA: Diagnosis not present

## 2018-02-14 DIAGNOSIS — Z125 Encounter for screening for malignant neoplasm of prostate: Secondary | ICD-10-CM | POA: Diagnosis not present

## 2018-02-14 DIAGNOSIS — R809 Proteinuria, unspecified: Secondary | ICD-10-CM | POA: Diagnosis not present

## 2018-02-14 DIAGNOSIS — E7849 Other hyperlipidemia: Secondary | ICD-10-CM | POA: Diagnosis not present

## 2018-02-14 DIAGNOSIS — I10 Essential (primary) hypertension: Secondary | ICD-10-CM | POA: Diagnosis not present

## 2018-02-14 DIAGNOSIS — E1129 Type 2 diabetes mellitus with other diabetic kidney complication: Secondary | ICD-10-CM | POA: Diagnosis not present

## 2018-02-14 DIAGNOSIS — D649 Anemia, unspecified: Secondary | ICD-10-CM | POA: Diagnosis not present

## 2018-02-21 DIAGNOSIS — M1712 Unilateral primary osteoarthritis, left knee: Secondary | ICD-10-CM | POA: Diagnosis not present

## 2018-02-21 DIAGNOSIS — E7849 Other hyperlipidemia: Secondary | ICD-10-CM | POA: Diagnosis not present

## 2018-02-21 DIAGNOSIS — R2689 Other abnormalities of gait and mobility: Secondary | ICD-10-CM | POA: Diagnosis not present

## 2018-02-21 DIAGNOSIS — Z0000 Encounter for general adult medical examination without abnormal findings: Secondary | ICD-10-CM | POA: Diagnosis not present

## 2018-02-21 DIAGNOSIS — D649 Anemia, unspecified: Secondary | ICD-10-CM | POA: Diagnosis not present

## 2018-02-21 DIAGNOSIS — R202 Paresthesia of skin: Secondary | ICD-10-CM | POA: Diagnosis not present

## 2018-02-21 DIAGNOSIS — I10 Essential (primary) hypertension: Secondary | ICD-10-CM | POA: Diagnosis not present

## 2018-02-21 DIAGNOSIS — R809 Proteinuria, unspecified: Secondary | ICD-10-CM | POA: Diagnosis not present

## 2018-02-21 DIAGNOSIS — E1129 Type 2 diabetes mellitus with other diabetic kidney complication: Secondary | ICD-10-CM | POA: Diagnosis not present

## 2018-03-12 ENCOUNTER — Ambulatory Visit: Payer: PPO | Attending: Internal Medicine

## 2018-03-12 DIAGNOSIS — R2681 Unsteadiness on feet: Secondary | ICD-10-CM | POA: Insufficient documentation
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
## 2018-03-14 ENCOUNTER — Ambulatory Visit: Payer: PPO

## 2018-03-14 DIAGNOSIS — R2681 Unsteadiness on feet: Secondary | ICD-10-CM
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
## 2018-03-19 ENCOUNTER — Ambulatory Visit: Payer: PPO

## 2018-03-19 DIAGNOSIS — R2681 Unsteadiness on feet: Secondary | ICD-10-CM | POA: Diagnosis not present
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
## 2018-03-21 ENCOUNTER — Ambulatory Visit: Payer: PPO

## 2018-03-21 DIAGNOSIS — R2681 Unsteadiness on feet: Secondary | ICD-10-CM
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
## 2018-03-26 ENCOUNTER — Ambulatory Visit: Payer: PPO

## 2018-03-26 DIAGNOSIS — R2681 Unsteadiness on feet: Secondary | ICD-10-CM | POA: Diagnosis not present
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
## 2018-03-28 ENCOUNTER — Ambulatory Visit: Payer: PPO

## 2018-03-28 DIAGNOSIS — R2681 Unsteadiness on feet: Secondary | ICD-10-CM | POA: Diagnosis not present
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
## 2018-04-02 ENCOUNTER — Ambulatory Visit: Payer: PPO

## 2018-04-04 ENCOUNTER — Ambulatory Visit: Payer: PPO

## 2018-04-04 DIAGNOSIS — R2681 Unsteadiness on feet: Secondary | ICD-10-CM | POA: Diagnosis not present
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
## 2018-04-09 ENCOUNTER — Encounter: Payer: Self-pay | Admitting: Physical Therapy

## 2018-04-09 ENCOUNTER — Ambulatory Visit: Payer: PPO | Attending: Internal Medicine | Admitting: Physical Therapy

## 2018-04-09 VITALS — BP 134/88 | HR 60

## 2018-04-09 DIAGNOSIS — R2681 Unsteadiness on feet: Secondary | ICD-10-CM
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
## 2018-04-10 ENCOUNTER — Ambulatory Visit: Payer: PPO

## 2018-04-12 ENCOUNTER — Ambulatory Visit: Payer: PPO

## 2018-04-12 DIAGNOSIS — R2681 Unsteadiness on feet: Secondary | ICD-10-CM | POA: Diagnosis not present
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
## 2018-04-16 ENCOUNTER — Ambulatory Visit: Payer: PPO

## 2018-04-16 VITALS — BP 155/93 | HR 62

## 2018-04-16 DIAGNOSIS — R2681 Unsteadiness on feet: Secondary | ICD-10-CM | POA: Diagnosis not present
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
## 2018-04-18 ENCOUNTER — Ambulatory Visit: Payer: PPO

## 2018-04-18 VITALS — BP 149/90 | HR 54

## 2018-04-18 DIAGNOSIS — R2681 Unsteadiness on feet: Secondary | ICD-10-CM
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
## 2018-04-24 ENCOUNTER — Ambulatory Visit: Payer: PPO

## 2018-04-26 ENCOUNTER — Ambulatory Visit: Payer: PPO

## 2018-08-15 DIAGNOSIS — I10 Essential (primary) hypertension: Secondary | ICD-10-CM | POA: Diagnosis not present

## 2018-08-15 DIAGNOSIS — D649 Anemia, unspecified: Secondary | ICD-10-CM | POA: Diagnosis not present

## 2018-08-15 DIAGNOSIS — R809 Proteinuria, unspecified: Secondary | ICD-10-CM | POA: Diagnosis not present

## 2018-08-15 DIAGNOSIS — E7849 Other hyperlipidemia: Secondary | ICD-10-CM | POA: Diagnosis not present

## 2018-08-15 DIAGNOSIS — R202 Paresthesia of skin: Secondary | ICD-10-CM | POA: Diagnosis not present

## 2018-08-15 DIAGNOSIS — R2689 Other abnormalities of gait and mobility: Secondary | ICD-10-CM | POA: Diagnosis not present

## 2018-08-15 DIAGNOSIS — E1129 Type 2 diabetes mellitus with other diabetic kidney complication: Secondary | ICD-10-CM | POA: Diagnosis not present

## 2018-08-22 DIAGNOSIS — E538 Deficiency of other specified B group vitamins: Secondary | ICD-10-CM | POA: Diagnosis not present

## 2018-08-22 DIAGNOSIS — R809 Proteinuria, unspecified: Secondary | ICD-10-CM | POA: Diagnosis not present

## 2018-08-22 DIAGNOSIS — E1129 Type 2 diabetes mellitus with other diabetic kidney complication: Secondary | ICD-10-CM | POA: Diagnosis not present

## 2018-08-22 DIAGNOSIS — Z125 Encounter for screening for malignant neoplasm of prostate: Secondary | ICD-10-CM | POA: Diagnosis not present

## 2018-08-22 DIAGNOSIS — I10 Essential (primary) hypertension: Secondary | ICD-10-CM | POA: Diagnosis not present

## 2018-08-22 DIAGNOSIS — R490 Dysphonia: Secondary | ICD-10-CM | POA: Diagnosis not present

## 2018-08-22 DIAGNOSIS — D649 Anemia, unspecified: Secondary | ICD-10-CM | POA: Diagnosis not present

## 2018-08-22 DIAGNOSIS — M1712 Unilateral primary osteoarthritis, left knee: Secondary | ICD-10-CM | POA: Diagnosis not present

## 2018-08-22 DIAGNOSIS — E7849 Other hyperlipidemia: Secondary | ICD-10-CM | POA: Diagnosis not present

## 2018-09-19 ENCOUNTER — Emergency Department: Payer: PPO

## 2018-09-19 ENCOUNTER — Emergency Department
Admission: EM | Admit: 2018-09-19 | Discharge: 2018-09-19 | Disposition: A | Discharge status: Home / Self Care | Payer: PPO | Attending: Emergency Medicine | Admitting: Emergency Medicine

## 2018-09-19 ENCOUNTER — Other Ambulatory Visit: Payer: Self-pay

## 2018-09-19 DIAGNOSIS — J9811 Atelectasis: Secondary | ICD-10-CM | POA: Diagnosis not present

## 2018-09-19 DIAGNOSIS — Z7901 Long term (current) use of anticoagulants: Secondary | ICD-10-CM | POA: Insufficient documentation

## 2018-09-19 DIAGNOSIS — R0789 Other chest pain: Secondary | ICD-10-CM | POA: Diagnosis not present

## 2018-09-19 DIAGNOSIS — Z8673 Personal history of transient ischemic attack (TIA), and cerebral infarction without residual deficits: Secondary | ICD-10-CM | POA: Diagnosis not present

## 2018-09-19 DIAGNOSIS — I10 Essential (primary) hypertension: Secondary | ICD-10-CM | POA: Insufficient documentation

## 2018-09-19 DIAGNOSIS — Z85828 Personal history of other malignant neoplasm of skin: Secondary | ICD-10-CM | POA: Diagnosis not present

## 2018-09-19 DIAGNOSIS — Z79899 Other long term (current) drug therapy: Secondary | ICD-10-CM | POA: Diagnosis not present

## 2018-09-19 DIAGNOSIS — R079 Chest pain, unspecified: Secondary | ICD-10-CM | POA: Diagnosis not present

## 2018-09-19 DIAGNOSIS — J90 Pleural effusion, not elsewhere classified: Secondary | ICD-10-CM | POA: Diagnosis not present

## 2018-09-19 DIAGNOSIS — R109 Unspecified abdominal pain: Secondary | ICD-10-CM | POA: Diagnosis not present

## 2018-09-19 LAB — BASIC METABOLIC PANEL
Anion gap: 6 (ref 5–15)
BUN: 12 mg/dL (ref 6–20)
CO2: 25 mmol/L (ref 22–32)
Calcium: 8.5 mg/dL — ABNORMAL LOW (ref 8.9–10.3)
Chloride: 104 mmol/L (ref 98–111)
Creatinine, Ser: 0.93 mg/dL (ref 0.61–1.24)
GFR calc Af Amer: >60 mL/min (ref >60)
GFR calc non Af Amer: >60 mL/min (ref >60)
Glucose, Bld: 142 mg/dL — ABNORMAL HIGH (ref 70–99)
Potassium: 3.4 mmol/L — ABNORMAL LOW (ref 3.5–5.1)
Sodium: 135 mmol/L (ref 135–145)

## 2018-09-19 LAB — URINALYSIS, COMPLETE (UACMP) WITH MICROSCOPIC
Bacteria, UA: NONE SEEN
Bilirubin Urine: NEGATIVE
Glucose, UA: NEGATIVE mg/dL
Hgb urine dipstick: NEGATIVE
Ketones, ur: NEGATIVE mg/dL
Leukocytes,Ua: NEGATIVE
Nitrite: NEGATIVE
Protein, ur: NEGATIVE mg/dL
Specific Gravity, Urine: 1.009 (ref 1.005–1.030)
Squamous Epithelial / LPF: NONE SEEN (ref 0–5)
pH: 7 (ref 5.0–8.0)

## 2018-09-19 LAB — CBC
HCT: 37.2 % — ABNORMAL LOW (ref 39.0–52.0)
Hemoglobin: 12.4 g/dL — ABNORMAL LOW (ref 13.0–17.0)
MCH: 27.8 pg (ref 26.0–34.0)
MCHC: 33.3 g/dL (ref 30.0–36.0)
MCV: 83.4 fL (ref 80.0–100.0)
Platelets: 253 10*3/uL (ref 150–400)
RBC: 4.46 MIL/uL (ref 4.22–5.81)
RDW: 12.3 % (ref 11.5–15.5)
WBC: 8.2 10*3/uL (ref 4.0–10.5)
nRBC: 0 % (ref 0.0–0.2)

## 2018-09-19 LAB — TROPONIN I (HIGH SENSITIVITY)
Troponin I (High Sensitivity): 3 ng/L (ref <18)
Troponin I (High Sensitivity): 4 ng/L (ref <18)

## 2018-09-19 MED ORDER — NITROGLYCERIN 0.4 MG SL SUBL
0.4000 mg | SUBLINGUAL_TABLET | SUBLINGUAL | Status: DC | PRN
  Administered 2018-09-19: 16:00:00 0.4 mg via SUBLINGUAL
  Filled 2018-09-19: qty 1

## 2018-09-19 MED ORDER — SODIUM CHLORIDE 0.9% FLUSH
3.0000 mL | Freq: Once | INTRAVENOUS | Status: AC
  Administered 2018-09-19: 3 mL via INTRAVENOUS

## 2018-09-19 MED ORDER — ACETAMINOPHEN 500 MG PO TABS
1000.0000 mg | ORAL_TABLET | Freq: Once | ORAL | Status: AC
  Administered 2018-09-19: 1000 mg via ORAL
  Filled 2018-09-19: qty 2

## 2018-09-19 MED ORDER — ALUM & MAG HYDROXIDE-SIMETH 200-200-20 MG/5ML PO SUSP
30.0000 mL | Freq: Once | ORAL | Status: AC
  Administered 2018-09-19: 16:00:00 30 mL via ORAL
  Filled 2018-09-19: qty 30

## 2018-09-19 NOTE — ED Notes (Signed)
Pt alert and oriented X4, active, cooperative, pt in NAD. RR even and unlabored, color WNL.  Pt informed to return if any life threatening symptoms occur.  Discharge and followup instructions reviewed. Ambulates safely. 

## 2018-09-19 NOTE — Discharge Instructions (Addendum)
Your chest x-ray and labs today were all okay.  You should follow-up with your primary care doctor for continued monitoring of your symptoms.  It may be time to repeat cardiac screening with a stress test or other procedures.

## 2018-09-19 NOTE — ED Notes (Signed)
ED Provider at bedside. 

## 2018-09-19 NOTE — ED Triage Notes (Signed)
Chest pain to left side and left flank that lasted a few minutes, started this AM and has been intermittent. Denies pain at this time. Pt alert and oriented X4, active, cooperative, pt in NAD. RR even and unlabored, color WNL.  VSS with EMS
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
## 2018-09-28 DIAGNOSIS — E538 Deficiency of other specified B group vitamins: Secondary | ICD-10-CM | POA: Diagnosis not present

## 2018-09-28 DIAGNOSIS — H47013 Ischemic optic neuropathy, bilateral: Secondary | ICD-10-CM | POA: Diagnosis not present

## 2018-09-28 DIAGNOSIS — I10 Essential (primary) hypertension: Secondary | ICD-10-CM | POA: Diagnosis not present

## 2018-09-28 DIAGNOSIS — E7849 Other hyperlipidemia: Secondary | ICD-10-CM | POA: Diagnosis not present

## 2018-09-28 DIAGNOSIS — E1129 Type 2 diabetes mellitus with other diabetic kidney complication: Secondary | ICD-10-CM | POA: Diagnosis not present

## 2018-09-28 DIAGNOSIS — R809 Proteinuria, unspecified: Secondary | ICD-10-CM | POA: Diagnosis not present

## 2018-09-28 DIAGNOSIS — R079 Chest pain, unspecified: Secondary | ICD-10-CM | POA: Diagnosis not present

## 2018-10-05 DIAGNOSIS — R079 Chest pain, unspecified: Secondary | ICD-10-CM | POA: Diagnosis not present

## 2018-10-31 DIAGNOSIS — H00012 Hordeolum externum right lower eyelid: Secondary | ICD-10-CM | POA: Diagnosis not present

## 2018-11-23 DIAGNOSIS — M94261 Chondromalacia, right knee: Secondary | ICD-10-CM | POA: Diagnosis not present

## 2018-11-23 DIAGNOSIS — S86911D Strain of unspecified muscle(s) and tendon(s) at lower leg level, right leg, subsequent encounter: Secondary | ICD-10-CM | POA: Diagnosis not present

## 2018-12-28 DIAGNOSIS — M1712 Unilateral primary osteoarthritis, left knee: Secondary | ICD-10-CM | POA: Diagnosis not present

## 2018-12-28 DIAGNOSIS — E7849 Other hyperlipidemia: Secondary | ICD-10-CM | POA: Diagnosis not present

## 2018-12-28 DIAGNOSIS — R519 Headache, unspecified: Secondary | ICD-10-CM | POA: Diagnosis not present

## 2018-12-28 DIAGNOSIS — Z23 Encounter for immunization: Secondary | ICD-10-CM | POA: Diagnosis not present

## 2018-12-28 DIAGNOSIS — I10 Essential (primary) hypertension: Secondary | ICD-10-CM | POA: Diagnosis not present

## 2018-12-28 DIAGNOSIS — E538 Deficiency of other specified B group vitamins: Secondary | ICD-10-CM | POA: Diagnosis not present

## 2019-01-09 ENCOUNTER — Other Ambulatory Visit: Payer: Self-pay | Admitting: Acute Care

## 2019-01-09 DIAGNOSIS — R519 Headache, unspecified: Secondary | ICD-10-CM | POA: Diagnosis not present

## 2019-01-09 DIAGNOSIS — Z8673 Personal history of transient ischemic attack (TIA), and cerebral infarction without residual deficits: Secondary | ICD-10-CM | POA: Diagnosis not present

## 2019-01-24 ENCOUNTER — Other Ambulatory Visit: Payer: Self-pay

## 2019-01-24 ENCOUNTER — Ambulatory Visit
Admission: RE | Admit: 2019-01-24 | Discharge: 2019-01-24 | Disposition: A | Discharge status: Home / Self Care | Payer: PPO | Source: Ambulatory Visit | Attending: Acute Care | Admitting: Acute Care

## 2019-01-24 DIAGNOSIS — Z8673 Personal history of transient ischemic attack (TIA), and cerebral infarction without residual deficits: Secondary | ICD-10-CM | POA: Insufficient documentation

## 2019-01-24 DIAGNOSIS — R519 Headache, unspecified: Secondary | ICD-10-CM | POA: Insufficient documentation

## 2019-02-18 DIAGNOSIS — G43C0 Periodic headache syndromes in child or adult, not intractable: Secondary | ICD-10-CM | POA: Diagnosis not present

## 2019-02-18 DIAGNOSIS — K6282 Dysplasia of anus: Secondary | ICD-10-CM | POA: Diagnosis not present

## 2019-02-18 DIAGNOSIS — R85610 Atypical squamous cells of undetermined significance on cytologic smear of anus (ASC-US): Secondary | ICD-10-CM | POA: Diagnosis not present

## 2019-02-18 DIAGNOSIS — Z01812 Encounter for preprocedural laboratory examination: Secondary | ICD-10-CM | POA: Diagnosis not present

## 2019-02-18 DIAGNOSIS — K625 Hemorrhage of anus and rectum: Secondary | ICD-10-CM | POA: Diagnosis not present

## 2019-02-19 DIAGNOSIS — I10 Essential (primary) hypertension: Secondary | ICD-10-CM | POA: Diagnosis not present

## 2019-02-19 DIAGNOSIS — Z125 Encounter for screening for malignant neoplasm of prostate: Secondary | ICD-10-CM | POA: Diagnosis not present

## 2019-02-19 DIAGNOSIS — E538 Deficiency of other specified B group vitamins: Secondary | ICD-10-CM | POA: Diagnosis not present

## 2019-02-19 DIAGNOSIS — R809 Proteinuria, unspecified: Secondary | ICD-10-CM | POA: Diagnosis not present

## 2019-02-19 DIAGNOSIS — E1129 Type 2 diabetes mellitus with other diabetic kidney complication: Secondary | ICD-10-CM | POA: Diagnosis not present

## 2019-02-19 DIAGNOSIS — E7849 Other hyperlipidemia: Secondary | ICD-10-CM | POA: Diagnosis not present

## 2019-02-20 DIAGNOSIS — R519 Headache, unspecified: Secondary | ICD-10-CM | POA: Diagnosis not present

## 2019-02-20 DIAGNOSIS — Z8673 Personal history of transient ischemic attack (TIA), and cerebral infarction without residual deficits: Secondary | ICD-10-CM | POA: Diagnosis not present

## 2019-02-25 DIAGNOSIS — M1712 Unilateral primary osteoarthritis, left knee: Secondary | ICD-10-CM | POA: Diagnosis not present

## 2019-02-25 DIAGNOSIS — E538 Deficiency of other specified B group vitamins: Secondary | ICD-10-CM | POA: Diagnosis not present

## 2019-02-25 DIAGNOSIS — R809 Proteinuria, unspecified: Secondary | ICD-10-CM | POA: Diagnosis not present

## 2019-02-25 DIAGNOSIS — E1129 Type 2 diabetes mellitus with other diabetic kidney complication: Secondary | ICD-10-CM | POA: Diagnosis not present

## 2019-02-25 DIAGNOSIS — R490 Dysphonia: Secondary | ICD-10-CM | POA: Diagnosis not present

## 2019-02-25 DIAGNOSIS — D649 Anemia, unspecified: Secondary | ICD-10-CM | POA: Diagnosis not present

## 2019-02-25 DIAGNOSIS — I10 Essential (primary) hypertension: Secondary | ICD-10-CM | POA: Diagnosis not present

## 2019-02-25 DIAGNOSIS — E7849 Other hyperlipidemia: Secondary | ICD-10-CM | POA: Diagnosis not present

## 2019-02-25 DIAGNOSIS — Z0000 Encounter for general adult medical examination without abnormal findings: Secondary | ICD-10-CM | POA: Diagnosis not present

## 2019-02-25 DIAGNOSIS — R519 Headache, unspecified: Secondary | ICD-10-CM | POA: Diagnosis not present

## 2019-03-08 HISTORY — PX: TONSILLECTOMY: SUR1361

## 2019-03-24 DIAGNOSIS — Z01812 Encounter for preprocedural laboratory examination: Secondary | ICD-10-CM | POA: Diagnosis not present

## 2019-03-24 DIAGNOSIS — Z20822 Contact with and (suspected) exposure to covid-19: Secondary | ICD-10-CM | POA: Diagnosis not present

## 2019-03-27 DIAGNOSIS — K625 Hemorrhage of anus and rectum: Secondary | ICD-10-CM | POA: Diagnosis not present

## 2019-03-27 DIAGNOSIS — K644 Residual hemorrhoidal skin tags: Secondary | ICD-10-CM | POA: Diagnosis not present

## 2019-03-27 DIAGNOSIS — K5730 Diverticulosis of large intestine without perforation or abscess without bleeding: Secondary | ICD-10-CM | POA: Diagnosis not present

## 2019-03-27 DIAGNOSIS — K921 Melena: Secondary | ICD-10-CM | POA: Diagnosis not present

## 2019-03-29 DIAGNOSIS — Z20822 Contact with and (suspected) exposure to covid-19: Secondary | ICD-10-CM | POA: Diagnosis not present

## 2019-04-04 DIAGNOSIS — R519 Headache, unspecified: Secondary | ICD-10-CM | POA: Diagnosis not present

## 2019-04-04 DIAGNOSIS — Z8673 Personal history of transient ischemic attack (TIA), and cerebral infarction without residual deficits: Secondary | ICD-10-CM | POA: Diagnosis not present

## 2019-04-16 DIAGNOSIS — I10 Essential (primary) hypertension: Secondary | ICD-10-CM | POA: Diagnosis not present

## 2019-04-16 DIAGNOSIS — D649 Anemia, unspecified: Secondary | ICD-10-CM | POA: Diagnosis not present

## 2019-04-16 DIAGNOSIS — M1712 Unilateral primary osteoarthritis, left knee: Secondary | ICD-10-CM | POA: Diagnosis not present

## 2019-04-16 DIAGNOSIS — E538 Deficiency of other specified B group vitamins: Secondary | ICD-10-CM | POA: Diagnosis not present

## 2019-04-16 DIAGNOSIS — R590 Localized enlarged lymph nodes: Secondary | ICD-10-CM | POA: Diagnosis not present

## 2019-04-16 DIAGNOSIS — E7849 Other hyperlipidemia: Secondary | ICD-10-CM | POA: Diagnosis not present

## 2019-04-16 DIAGNOSIS — R809 Proteinuria, unspecified: Secondary | ICD-10-CM | POA: Diagnosis not present

## 2019-04-16 DIAGNOSIS — E1129 Type 2 diabetes mellitus with other diabetic kidney complication: Secondary | ICD-10-CM | POA: Diagnosis not present

## 2019-04-17 ENCOUNTER — Other Ambulatory Visit: Payer: Self-pay | Admitting: Internal Medicine

## 2019-04-17 ENCOUNTER — Other Ambulatory Visit (HOSPITAL_COMMUNITY): Payer: Self-pay | Admitting: Internal Medicine

## 2019-04-17 DIAGNOSIS — R590 Localized enlarged lymph nodes: Secondary | ICD-10-CM

## 2019-04-17 DIAGNOSIS — G43711 Chronic migraine without aura, intractable, with status migrainosus: Secondary | ICD-10-CM | POA: Diagnosis not present

## 2019-04-19 ENCOUNTER — Other Ambulatory Visit: Payer: Self-pay

## 2019-04-19 ENCOUNTER — Other Ambulatory Visit
Admission: RE | Admit: 2019-04-19 | Discharge: 2019-04-19 | Disposition: A | Discharge status: Home / Self Care | Payer: PPO | Source: Home / Self Care | Attending: Internal Medicine | Admitting: Internal Medicine

## 2019-04-19 ENCOUNTER — Ambulatory Visit
Admission: RE | Admit: 2019-04-19 | Discharge: 2019-04-19 | Disposition: A | Discharge status: Home / Self Care | Payer: PPO | Source: Ambulatory Visit | Attending: Internal Medicine | Admitting: Internal Medicine

## 2019-04-19 DIAGNOSIS — R590 Localized enlarged lymph nodes: Secondary | ICD-10-CM

## 2019-04-19 DIAGNOSIS — R599 Enlarged lymph nodes, unspecified: Secondary | ICD-10-CM | POA: Diagnosis not present

## 2019-04-19 HISTORY — DX: Malignant (primary) neoplasm, unspecified: C80.1

## 2019-04-19 LAB — CREATININE, SERUM
Creatinine, Ser: 0.87 mg/dL (ref 0.61–1.24)
GFR calc Af Amer: >60 mL/min (ref >60)
GFR calc non Af Amer: >60 mL/min (ref >60)

## 2019-04-19 MED ORDER — IOHEXOL 300 MG/ML  SOLN
75.0000 mL | Freq: Once | INTRAMUSCULAR | Status: AC | PRN
  Administered 2019-04-19: 75 mL via INTRAVENOUS

## 2019-04-24 DIAGNOSIS — R221 Localized swelling, mass and lump, neck: Secondary | ICD-10-CM | POA: Diagnosis not present

## 2019-04-24 DIAGNOSIS — L040 Acute lymphadenitis of face, head and neck: Secondary | ICD-10-CM | POA: Diagnosis not present

## 2019-04-26 DIAGNOSIS — I639 Cerebral infarction, unspecified: Secondary | ICD-10-CM | POA: Insufficient documentation

## 2019-04-30 ENCOUNTER — Other Ambulatory Visit: Payer: Self-pay | Admitting: Otolaryngology

## 2019-04-30 DIAGNOSIS — R599 Enlarged lymph nodes, unspecified: Secondary | ICD-10-CM

## 2019-05-01 DIAGNOSIS — R519 Headache, unspecified: Secondary | ICD-10-CM | POA: Diagnosis not present

## 2019-05-01 DIAGNOSIS — R809 Proteinuria, unspecified: Secondary | ICD-10-CM | POA: Diagnosis not present

## 2019-05-01 DIAGNOSIS — I10 Essential (primary) hypertension: Secondary | ICD-10-CM | POA: Diagnosis not present

## 2019-05-01 DIAGNOSIS — R590 Localized enlarged lymph nodes: Secondary | ICD-10-CM | POA: Diagnosis not present

## 2019-05-01 DIAGNOSIS — E1129 Type 2 diabetes mellitus with other diabetic kidney complication: Secondary | ICD-10-CM | POA: Diagnosis not present

## 2019-05-01 DIAGNOSIS — E7849 Other hyperlipidemia: Secondary | ICD-10-CM | POA: Diagnosis not present

## 2019-05-07 ENCOUNTER — Other Ambulatory Visit: Payer: Self-pay | Admitting: Radiology

## 2019-05-08 ENCOUNTER — Ambulatory Visit
Admission: RE | Admit: 2019-05-08 | Discharge: 2019-05-08 | Disposition: A | Discharge status: Home / Self Care | Payer: PPO | Source: Ambulatory Visit | Attending: Otolaryngology | Admitting: Otolaryngology

## 2019-05-08 ENCOUNTER — Other Ambulatory Visit: Payer: Self-pay

## 2019-05-08 DIAGNOSIS — R590 Localized enlarged lymph nodes: Secondary | ICD-10-CM | POA: Diagnosis not present

## 2019-05-08 DIAGNOSIS — Z823 Family history of stroke: Secondary | ICD-10-CM | POA: Insufficient documentation

## 2019-05-08 DIAGNOSIS — I10 Essential (primary) hypertension: Secondary | ICD-10-CM | POA: Diagnosis not present

## 2019-05-08 DIAGNOSIS — H548 Legal blindness, as defined in USA: Secondary | ICD-10-CM | POA: Diagnosis not present

## 2019-05-08 DIAGNOSIS — M1990 Unspecified osteoarthritis, unspecified site: Secondary | ICD-10-CM | POA: Diagnosis not present

## 2019-05-08 DIAGNOSIS — Z8673 Personal history of transient ischemic attack (TIA), and cerebral infarction without residual deficits: Secondary | ICD-10-CM | POA: Diagnosis not present

## 2019-05-08 DIAGNOSIS — Z8042 Family history of malignant neoplasm of prostate: Secondary | ICD-10-CM | POA: Insufficient documentation

## 2019-05-08 DIAGNOSIS — Z79899 Other long term (current) drug therapy: Secondary | ICD-10-CM | POA: Diagnosis not present

## 2019-05-08 DIAGNOSIS — R599 Enlarged lymph nodes, unspecified: Secondary | ICD-10-CM

## 2019-05-08 DIAGNOSIS — Z8249 Family history of ischemic heart disease and other diseases of the circulatory system: Secondary | ICD-10-CM | POA: Insufficient documentation

## 2019-05-08 DIAGNOSIS — Z886 Allergy status to analgesic agent status: Secondary | ICD-10-CM | POA: Diagnosis not present

## 2019-05-08 DIAGNOSIS — Z86008 Personal history of in-situ neoplasm of other site: Secondary | ICD-10-CM | POA: Diagnosis not present

## 2019-05-08 DIAGNOSIS — E785 Hyperlipidemia, unspecified: Secondary | ICD-10-CM | POA: Diagnosis not present

## 2019-05-08 DIAGNOSIS — Z7902 Long term (current) use of antithrombotics/antiplatelets: Secondary | ICD-10-CM | POA: Insufficient documentation

## 2019-05-08 DIAGNOSIS — Z833 Family history of diabetes mellitus: Secondary | ICD-10-CM | POA: Diagnosis not present

## 2019-05-08 DIAGNOSIS — C770 Secondary and unspecified malignant neoplasm of lymph nodes of head, face and neck: Secondary | ICD-10-CM | POA: Diagnosis not present

## 2019-05-08 DIAGNOSIS — Z7984 Long term (current) use of oral hypoglycemic drugs: Secondary | ICD-10-CM | POA: Diagnosis not present

## 2019-05-08 DIAGNOSIS — Z803 Family history of malignant neoplasm of breast: Secondary | ICD-10-CM | POA: Insufficient documentation

## 2019-05-08 DIAGNOSIS — Z888 Allergy status to other drugs, medicaments and biological substances status: Secondary | ICD-10-CM | POA: Insufficient documentation

## 2019-05-08 DIAGNOSIS — Z885 Allergy status to narcotic agent status: Secondary | ICD-10-CM | POA: Diagnosis not present

## 2019-05-08 LAB — GLUCOSE, CAPILLARY: Glucose-Capillary: 82 mg/dL (ref 70–99)

## 2019-05-08 MED ORDER — MIDAZOLAM HCL 2 MG/2ML IJ SOLN
INTRAMUSCULAR | Status: AC | PRN
  Administered 2019-05-08: 1 mg via INTRAVENOUS

## 2019-05-08 MED ORDER — SODIUM CHLORIDE 0.9 % IV SOLN: INTRAVENOUS | Status: DC

## 2019-05-08 MED ORDER — MIDAZOLAM HCL 2 MG/2ML IJ SOLN
INTRAMUSCULAR | Status: AC
  Filled 2019-05-08: qty 2

## 2019-05-08 MED ORDER — FENTANYL CITRATE (PF) 100 MCG/2ML IJ SOLN
INTRAMUSCULAR | Status: AC
  Filled 2019-05-08: qty 2

## 2019-05-08 MED ORDER — FENTANYL CITRATE (PF) 100 MCG/2ML IJ SOLN
INTRAMUSCULAR | Status: AC | PRN
  Administered 2019-05-08: 50 ug via INTRAVENOUS
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
## 2019-05-08 NOTE — Procedures (Signed)
Pre Procedure Dx: Cervical lymphadenopathy Post Procedural Dx: Same  Technically successful US guided biopsy of indeterminate right cervical lymph node.  EBL: None  No immediate complications.   Ronny Bacon, MD Pager #: [phone]

## 2019-05-09 LAB — SURGICAL PATHOLOGY

## 2019-05-10 ENCOUNTER — Other Ambulatory Visit: Payer: Self-pay | Admitting: Otolaryngology

## 2019-05-10 DIAGNOSIS — R221 Localized swelling, mass and lump, neck: Secondary | ICD-10-CM

## 2019-05-15 ENCOUNTER — Ambulatory Visit
Admission: RE | Admit: 2019-05-15 | Discharge: 2019-05-15 | Disposition: A | Discharge status: Home / Self Care | Payer: PPO | Source: Ambulatory Visit | Attending: Otolaryngology | Admitting: Otolaryngology

## 2019-05-15 ENCOUNTER — Other Ambulatory Visit: Payer: Self-pay

## 2019-05-15 DIAGNOSIS — N200 Calculus of kidney: Secondary | ICD-10-CM | POA: Diagnosis not present

## 2019-05-15 DIAGNOSIS — K5730 Diverticulosis of large intestine without perforation or abscess without bleeding: Secondary | ICD-10-CM | POA: Diagnosis not present

## 2019-05-15 DIAGNOSIS — K760 Fatty (change of) liver, not elsewhere classified: Secondary | ICD-10-CM | POA: Diagnosis not present

## 2019-05-15 DIAGNOSIS — R590 Localized enlarged lymph nodes: Secondary | ICD-10-CM | POA: Diagnosis not present

## 2019-05-15 DIAGNOSIS — K449 Diaphragmatic hernia without obstruction or gangrene: Secondary | ICD-10-CM | POA: Diagnosis not present

## 2019-05-15 DIAGNOSIS — R221 Localized swelling, mass and lump, neck: Secondary | ICD-10-CM | POA: Diagnosis not present

## 2019-05-15 LAB — GLUCOSE, CAPILLARY: Glucose-Capillary: 84 mg/dL (ref 70–99)

## 2019-05-15 MED ORDER — FLUDEOXYGLUCOSE F - 18 (FDG) INJECTION
9.9000 | Freq: Once | INTRAVENOUS | Status: AC | PRN
  Administered 2019-05-15: 10.39 via INTRAVENOUS

## 2019-05-20 DIAGNOSIS — M545 Low back pain: Secondary | ICD-10-CM | POA: Diagnosis not present

## 2019-05-20 DIAGNOSIS — M5416 Radiculopathy, lumbar region: Secondary | ICD-10-CM | POA: Diagnosis not present

## 2019-05-20 DIAGNOSIS — S39012A Strain of muscle, fascia and tendon of lower back, initial encounter: Secondary | ICD-10-CM | POA: Diagnosis not present

## 2019-05-23 DIAGNOSIS — R221 Localized swelling, mass and lump, neck: Secondary | ICD-10-CM | POA: Diagnosis not present

## 2019-05-24 DIAGNOSIS — R221 Localized swelling, mass and lump, neck: Secondary | ICD-10-CM | POA: Diagnosis not present

## 2019-05-28 DIAGNOSIS — R221 Localized swelling, mass and lump, neck: Secondary | ICD-10-CM | POA: Diagnosis not present

## 2019-05-30 DIAGNOSIS — M5416 Radiculopathy, lumbar region: Secondary | ICD-10-CM | POA: Diagnosis not present

## 2019-05-31 DIAGNOSIS — R221 Localized swelling, mass and lump, neck: Secondary | ICD-10-CM | POA: Diagnosis not present

## 2019-05-31 DIAGNOSIS — I69398 Other sequelae of cerebral infarction: Secondary | ICD-10-CM | POA: Diagnosis not present

## 2019-05-31 DIAGNOSIS — Z7902 Long term (current) use of antithrombotics/antiplatelets: Secondary | ICD-10-CM | POA: Diagnosis not present

## 2019-05-31 DIAGNOSIS — Z85828 Personal history of other malignant neoplasm of skin: Secondary | ICD-10-CM | POA: Diagnosis not present

## 2019-05-31 DIAGNOSIS — E119 Type 2 diabetes mellitus without complications: Secondary | ICD-10-CM | POA: Diagnosis not present

## 2019-05-31 DIAGNOSIS — R519 Headache, unspecified: Secondary | ICD-10-CM | POA: Diagnosis not present

## 2019-05-31 DIAGNOSIS — Z791 Long term (current) use of non-steroidal anti-inflammatories (NSAID): Secondary | ICD-10-CM | POA: Diagnosis not present

## 2019-05-31 DIAGNOSIS — Z96659 Presence of unspecified artificial knee joint: Secondary | ICD-10-CM | POA: Diagnosis not present

## 2019-05-31 DIAGNOSIS — Z885 Allergy status to narcotic agent status: Secondary | ICD-10-CM | POA: Diagnosis not present

## 2019-05-31 DIAGNOSIS — Z7984 Long term (current) use of oral hypoglycemic drugs: Secondary | ICD-10-CM | POA: Diagnosis not present

## 2019-05-31 DIAGNOSIS — Z79899 Other long term (current) drug therapy: Secondary | ICD-10-CM | POA: Diagnosis not present

## 2019-05-31 DIAGNOSIS — H548 Legal blindness, as defined in USA: Secondary | ICD-10-CM | POA: Diagnosis not present

## 2019-05-31 DIAGNOSIS — H469 Unspecified optic neuritis: Secondary | ICD-10-CM | POA: Diagnosis not present

## 2019-05-31 DIAGNOSIS — Z809 Family history of malignant neoplasm, unspecified: Secondary | ICD-10-CM | POA: Diagnosis not present

## 2019-05-31 DIAGNOSIS — Z886 Allergy status to analgesic agent status: Secondary | ICD-10-CM | POA: Diagnosis not present

## 2019-06-04 DIAGNOSIS — M5416 Radiculopathy, lumbar region: Secondary | ICD-10-CM | POA: Diagnosis not present

## 2019-06-07 DIAGNOSIS — Z8673 Personal history of transient ischemic attack (TIA), and cerebral infarction without residual deficits: Secondary | ICD-10-CM | POA: Diagnosis not present

## 2019-06-07 DIAGNOSIS — G43809 Other migraine, not intractable, without status migrainosus: Secondary | ICD-10-CM | POA: Diagnosis not present

## 2019-06-07 DIAGNOSIS — Z01818 Encounter for other preprocedural examination: Secondary | ICD-10-CM | POA: Diagnosis not present

## 2019-06-07 DIAGNOSIS — R221 Localized swelling, mass and lump, neck: Secondary | ICD-10-CM | POA: Diagnosis not present

## 2019-06-07 DIAGNOSIS — I10 Essential (primary) hypertension: Secondary | ICD-10-CM | POA: Diagnosis not present

## 2019-06-07 DIAGNOSIS — H548 Legal blindness, as defined in USA: Secondary | ICD-10-CM | POA: Diagnosis not present

## 2019-06-07 DIAGNOSIS — R001 Bradycardia, unspecified: Secondary | ICD-10-CM | POA: Diagnosis not present

## 2019-06-07 DIAGNOSIS — Z01812 Encounter for preprocedural laboratory examination: Secondary | ICD-10-CM | POA: Diagnosis not present

## 2019-06-07 DIAGNOSIS — E785 Hyperlipidemia, unspecified: Secondary | ICD-10-CM | POA: Diagnosis not present

## 2019-06-07 DIAGNOSIS — E119 Type 2 diabetes mellitus without complications: Secondary | ICD-10-CM | POA: Diagnosis not present

## 2019-06-07 DIAGNOSIS — Z20822 Contact with and (suspected) exposure to covid-19: Secondary | ICD-10-CM | POA: Diagnosis not present

## 2019-06-10 DIAGNOSIS — I10 Essential (primary) hypertension: Secondary | ICD-10-CM | POA: Diagnosis not present

## 2019-06-10 DIAGNOSIS — R809 Proteinuria, unspecified: Secondary | ICD-10-CM | POA: Diagnosis not present

## 2019-06-10 DIAGNOSIS — D649 Anemia, unspecified: Secondary | ICD-10-CM | POA: Diagnosis not present

## 2019-06-10 DIAGNOSIS — E538 Deficiency of other specified B group vitamins: Secondary | ICD-10-CM | POA: Diagnosis not present

## 2019-06-10 DIAGNOSIS — E7849 Other hyperlipidemia: Secondary | ICD-10-CM | POA: Diagnosis not present

## 2019-06-10 DIAGNOSIS — E1129 Type 2 diabetes mellitus with other diabetic kidney complication: Secondary | ICD-10-CM | POA: Diagnosis not present

## 2019-06-11 DIAGNOSIS — R221 Localized swelling, mass and lump, neck: Secondary | ICD-10-CM | POA: Diagnosis not present

## 2019-06-11 DIAGNOSIS — I10 Essential (primary) hypertension: Secondary | ICD-10-CM | POA: Diagnosis not present

## 2019-06-11 DIAGNOSIS — Z96652 Presence of left artificial knee joint: Secondary | ICD-10-CM | POA: Diagnosis not present

## 2019-06-11 DIAGNOSIS — Z8673 Personal history of transient ischemic attack (TIA), and cerebral infarction without residual deficits: Secondary | ICD-10-CM | POA: Diagnosis not present

## 2019-06-11 DIAGNOSIS — E785 Hyperlipidemia, unspecified: Secondary | ICD-10-CM | POA: Diagnosis not present

## 2019-06-11 DIAGNOSIS — E119 Type 2 diabetes mellitus without complications: Secondary | ICD-10-CM | POA: Diagnosis not present

## 2019-06-11 DIAGNOSIS — M1990 Unspecified osteoarthritis, unspecified site: Secondary | ICD-10-CM | POA: Diagnosis not present

## 2019-06-11 DIAGNOSIS — H548 Legal blindness, as defined in USA: Secondary | ICD-10-CM | POA: Diagnosis not present

## 2019-06-11 DIAGNOSIS — Z7984 Long term (current) use of oral hypoglycemic drugs: Secondary | ICD-10-CM | POA: Diagnosis not present

## 2019-06-11 DIAGNOSIS — Z79899 Other long term (current) drug therapy: Secondary | ICD-10-CM | POA: Diagnosis not present

## 2019-06-11 DIAGNOSIS — Q180 Sinus, fistula and cyst of branchial cleft: Secondary | ICD-10-CM | POA: Diagnosis not present

## 2019-06-11 DIAGNOSIS — Z885 Allergy status to narcotic agent status: Secondary | ICD-10-CM | POA: Diagnosis not present

## 2019-06-19 ENCOUNTER — Other Ambulatory Visit: Payer: Self-pay | Admitting: Surgery

## 2019-06-19 DIAGNOSIS — S39012S Strain of muscle, fascia and tendon of lower back, sequela: Secondary | ICD-10-CM

## 2019-06-19 DIAGNOSIS — M5440 Lumbago with sciatica, unspecified side: Secondary | ICD-10-CM

## 2019-06-19 DIAGNOSIS — Z09 Encounter for follow-up examination after completed treatment for conditions other than malignant neoplasm: Secondary | ICD-10-CM | POA: Diagnosis not present

## 2019-06-19 DIAGNOSIS — R221 Localized swelling, mass and lump, neck: Secondary | ICD-10-CM | POA: Diagnosis not present

## 2019-06-22 ENCOUNTER — Ambulatory Visit
Admission: RE | Admit: 2019-06-22 | Discharge: 2019-06-22 | Disposition: A | Discharge status: Home / Self Care | Payer: PPO | Source: Ambulatory Visit | Attending: Surgery | Admitting: Surgery

## 2019-06-22 ENCOUNTER — Other Ambulatory Visit: Payer: Self-pay

## 2019-06-22 ENCOUNTER — Emergency Department
Admission: EM | Admit: 2019-06-22 | Discharge: 2019-06-22 | Disposition: A | Discharge status: Home / Self Care | Payer: PPO | Source: Home / Self Care | Attending: Emergency Medicine | Admitting: Emergency Medicine

## 2019-06-22 DIAGNOSIS — Y93G1 Activity, food preparation and clean up: Secondary | ICD-10-CM | POA: Insufficient documentation

## 2019-06-22 DIAGNOSIS — S39012S Strain of muscle, fascia and tendon of lower back, sequela: Secondary | ICD-10-CM | POA: Insufficient documentation

## 2019-06-22 DIAGNOSIS — Z7901 Long term (current) use of anticoagulants: Secondary | ICD-10-CM | POA: Insufficient documentation

## 2019-06-22 DIAGNOSIS — Y929 Unspecified place or not applicable: Secondary | ICD-10-CM | POA: Insufficient documentation

## 2019-06-22 DIAGNOSIS — M5440 Lumbago with sciatica, unspecified side: Secondary | ICD-10-CM | POA: Insufficient documentation

## 2019-06-22 DIAGNOSIS — S61215A Laceration without foreign body of left ring finger without damage to nail, initial encounter: Secondary | ICD-10-CM

## 2019-06-22 DIAGNOSIS — Z79899 Other long term (current) drug therapy: Secondary | ICD-10-CM | POA: Insufficient documentation

## 2019-06-22 DIAGNOSIS — R296 Repeated falls: Secondary | ICD-10-CM | POA: Diagnosis not present

## 2019-06-22 DIAGNOSIS — D72829 Elevated white blood cell count, unspecified: Secondary | ICD-10-CM | POA: Diagnosis not present

## 2019-06-22 DIAGNOSIS — Y999 Unspecified external cause status: Secondary | ICD-10-CM | POA: Insufficient documentation

## 2019-06-22 DIAGNOSIS — R55 Syncope and collapse: Secondary | ICD-10-CM | POA: Diagnosis not present

## 2019-06-22 DIAGNOSIS — T148XXA Other injury of unspecified body region, initial encounter: Secondary | ICD-10-CM

## 2019-06-22 DIAGNOSIS — W260XXA Contact with knife, initial encounter: Secondary | ICD-10-CM | POA: Insufficient documentation

## 2019-06-22 DIAGNOSIS — I10 Essential (primary) hypertension: Secondary | ICD-10-CM | POA: Insufficient documentation

## 2019-06-22 DIAGNOSIS — Z03818 Encounter for observation for suspected exposure to other biological agents ruled out: Secondary | ICD-10-CM | POA: Diagnosis not present

## 2019-06-22 DIAGNOSIS — M48061 Spinal stenosis, lumbar region without neurogenic claudication: Secondary | ICD-10-CM | POA: Diagnosis not present

## 2019-06-22 DIAGNOSIS — E872 Acidosis: Secondary | ICD-10-CM | POA: Diagnosis not present

## 2019-06-22 MED ORDER — LIDOCAINE-EPINEPHRINE 1 %-1:100000 IJ SOLN
30.0000 mL | Freq: Once | INTRAMUSCULAR | Status: AC
  Administered 2019-06-22: 30 mL
  Filled 2019-06-22: qty 1

## 2019-06-22 NOTE — Discharge Instructions (Addendum)
Keep left ring finger clean and dry for 7 days.  In 7 days you can begin showering and getting wet and allow Dermabond to come off on its own.  If any increasing pain swelling warmth or redness please follow-up with primary care provider or ER.
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
## 2019-06-22 NOTE — ED Notes (Signed)
Pt states he cut left ring finger while cutting chicken. Bandage in place, not bleeding through. Pt takes blood thinners. EDP in room

## 2019-06-22 NOTE — ED Triage Notes (Signed)
Pt arrives to ER c/o of 4th digit lac. Was cutting chicken with scissors. Has bandaid on finger. Taken off, noticed tip of finger missing, like crater. Finger nail intact. Unsure of tetanus.

## 2019-06-24 ENCOUNTER — Observation Stay: Payer: PPO

## 2019-06-24 ENCOUNTER — Emergency Department: Payer: PPO

## 2019-06-24 ENCOUNTER — Encounter: Payer: Self-pay | Admitting: Emergency Medicine

## 2019-06-24 ENCOUNTER — Other Ambulatory Visit: Payer: Self-pay

## 2019-06-24 ENCOUNTER — Inpatient Hospital Stay
Admission: EM | Admit: 2019-06-24 | Discharge: 2019-06-27 | DRG: 312 | Disposition: A | Discharge status: Home / Self Care | Payer: PPO | Attending: Internal Medicine | Admitting: Internal Medicine

## 2019-06-24 DIAGNOSIS — Z886 Allergy status to analgesic agent status: Secondary | ICD-10-CM

## 2019-06-24 DIAGNOSIS — Z8673 Personal history of transient ischemic attack (TIA), and cerebral infarction without residual deficits: Secondary | ICD-10-CM

## 2019-06-24 DIAGNOSIS — Z20822 Contact with and (suspected) exposure to covid-19: Secondary | ICD-10-CM | POA: Diagnosis present

## 2019-06-24 DIAGNOSIS — E872 Acidosis, unspecified: Secondary | ICD-10-CM | POA: Diagnosis present

## 2019-06-24 DIAGNOSIS — Z803 Family history of malignant neoplasm of breast: Secondary | ICD-10-CM

## 2019-06-24 DIAGNOSIS — I10 Essential (primary) hypertension: Secondary | ICD-10-CM | POA: Diagnosis present

## 2019-06-24 DIAGNOSIS — D649 Anemia, unspecified: Secondary | ICD-10-CM | POA: Diagnosis present

## 2019-06-24 DIAGNOSIS — Z8042 Family history of malignant neoplasm of prostate: Secondary | ICD-10-CM

## 2019-06-24 DIAGNOSIS — Z79899 Other long term (current) drug therapy: Secondary | ICD-10-CM

## 2019-06-24 DIAGNOSIS — E119 Type 2 diabetes mellitus without complications: Secondary | ICD-10-CM | POA: Diagnosis not present

## 2019-06-24 DIAGNOSIS — R0902 Hypoxemia: Secondary | ICD-10-CM | POA: Diagnosis not present

## 2019-06-24 DIAGNOSIS — Z833 Family history of diabetes mellitus: Secondary | ICD-10-CM

## 2019-06-24 DIAGNOSIS — R55 Syncope and collapse: Secondary | ICD-10-CM | POA: Diagnosis not present

## 2019-06-24 DIAGNOSIS — S61215A Laceration without foreign body of left ring finger without damage to nail, initial encounter: Secondary | ICD-10-CM | POA: Diagnosis present

## 2019-06-24 DIAGNOSIS — R296 Repeated falls: Secondary | ICD-10-CM | POA: Diagnosis not present

## 2019-06-24 DIAGNOSIS — D72829 Elevated white blood cell count, unspecified: Secondary | ICD-10-CM | POA: Diagnosis not present

## 2019-06-24 DIAGNOSIS — H9202 Otalgia, left ear: Secondary | ICD-10-CM | POA: Diagnosis not present

## 2019-06-24 DIAGNOSIS — Z885 Allergy status to narcotic agent status: Secondary | ICD-10-CM

## 2019-06-24 DIAGNOSIS — Z888 Allergy status to other drugs, medicaments and biological substances status: Secondary | ICD-10-CM

## 2019-06-24 DIAGNOSIS — I959 Hypotension, unspecified: Secondary | ICD-10-CM | POA: Diagnosis present

## 2019-06-24 DIAGNOSIS — W19XXXA Unspecified fall, initial encounter: Secondary | ICD-10-CM | POA: Diagnosis not present

## 2019-06-24 DIAGNOSIS — R0602 Shortness of breath: Secondary | ICD-10-CM | POA: Diagnosis not present

## 2019-06-24 DIAGNOSIS — K56600 Partial intestinal obstruction, unspecified as to cause: Secondary | ICD-10-CM | POA: Diagnosis not present

## 2019-06-24 DIAGNOSIS — Z03818 Encounter for observation for suspected exposure to other biological agents ruled out: Secondary | ICD-10-CM | POA: Diagnosis not present

## 2019-06-24 DIAGNOSIS — Z7902 Long term (current) use of antithrombotics/antiplatelets: Secondary | ICD-10-CM

## 2019-06-24 DIAGNOSIS — Y92010 Kitchen of single-family (private) house as the place of occurrence of the external cause: Secondary | ICD-10-CM

## 2019-06-24 DIAGNOSIS — Z8719 Personal history of other diseases of the digestive system: Secondary | ICD-10-CM

## 2019-06-24 DIAGNOSIS — Z7984 Long term (current) use of oral hypoglycemic drugs: Secondary | ICD-10-CM

## 2019-06-24 DIAGNOSIS — Z8249 Family history of ischemic heart disease and other diseases of the circulatory system: Secondary | ICD-10-CM

## 2019-06-24 DIAGNOSIS — R042 Hemoptysis: Secondary | ICD-10-CM | POA: Diagnosis present

## 2019-06-24 DIAGNOSIS — E785 Hyperlipidemia, unspecified: Secondary | ICD-10-CM | POA: Diagnosis present

## 2019-06-24 DIAGNOSIS — Z791 Long term (current) use of non-steroidal anti-inflammatories (NSAID): Secondary | ICD-10-CM

## 2019-06-24 DIAGNOSIS — R000 Tachycardia, unspecified: Secondary | ICD-10-CM | POA: Diagnosis present

## 2019-06-24 DIAGNOSIS — Z7901 Long term (current) use of anticoagulants: Secondary | ICD-10-CM

## 2019-06-24 DIAGNOSIS — Z823 Family history of stroke: Secondary | ICD-10-CM

## 2019-06-24 DIAGNOSIS — Y93G9 Activity, other involving cooking and grilling: Secondary | ICD-10-CM

## 2019-06-24 DIAGNOSIS — R42 Dizziness and giddiness: Secondary | ICD-10-CM | POA: Diagnosis not present

## 2019-06-24 DIAGNOSIS — R531 Weakness: Secondary | ICD-10-CM | POA: Diagnosis not present

## 2019-06-24 DIAGNOSIS — E109 Type 1 diabetes mellitus without complications: Secondary | ICD-10-CM | POA: Diagnosis present

## 2019-06-24 DIAGNOSIS — W272XXA Contact with scissors, initial encounter: Secondary | ICD-10-CM | POA: Diagnosis present

## 2019-06-24 LAB — CBC WITH DIFFERENTIAL/PLATELET
Abs Immature Granulocytes: 0.14 10*3/uL — ABNORMAL HIGH (ref 0.00–0.07)
Basophils Absolute: 0.1 10*3/uL (ref 0.0–0.1)
Basophils Relative: 0 %
Eosinophils Absolute: 0.1 10*3/uL (ref 0.0–0.5)
Eosinophils Relative: 1 %
HCT: 38 % — ABNORMAL LOW (ref 39.0–52.0)
Hemoglobin: 12.4 g/dL — ABNORMAL LOW (ref 13.0–17.0)
Immature Granulocytes: 1 %
Lymphocytes Relative: 16 %
Lymphs Abs: 2.9 10*3/uL (ref 0.7–4.0)
MCH: 28.2 pg (ref 26.0–34.0)
MCHC: 32.6 g/dL (ref 30.0–36.0)
MCV: 86.6 fL (ref 80.0–100.0)
Monocytes Absolute: 1.3 10*3/uL — ABNORMAL HIGH (ref 0.1–1.0)
Monocytes Relative: 7 %
Neutro Abs: 13.9 10*3/uL — ABNORMAL HIGH (ref 1.7–7.7)
Neutrophils Relative %: 75 %
Platelets: 408 10*3/uL — ABNORMAL HIGH (ref 150–400)
RBC: 4.39 MIL/uL (ref 4.22–5.81)
RDW: 12.3 % (ref 11.5–15.5)
WBC: 18.3 10*3/uL — ABNORMAL HIGH (ref 4.0–10.5)
nRBC: 0 % (ref 0.0–0.2)

## 2019-06-24 LAB — URINALYSIS, COMPLETE (UACMP) WITH MICROSCOPIC
Bacteria, UA: NONE SEEN
Bilirubin Urine: NEGATIVE
Glucose, UA: NEGATIVE mg/dL
Hgb urine dipstick: NEGATIVE
Ketones, ur: 5 mg/dL — AB
Leukocytes,Ua: NEGATIVE
Nitrite: NEGATIVE
Protein, ur: NEGATIVE mg/dL
Specific Gravity, Urine: 1.012 (ref 1.005–1.030)
Squamous Epithelial / HPF: NONE SEEN (ref 0–5)
pH: 7 (ref 5.0–8.0)

## 2019-06-24 LAB — CBC
HCT: 32.8 % — ABNORMAL LOW (ref 39.0–52.0)
Hemoglobin: 10.8 g/dL — ABNORMAL LOW (ref 13.0–17.0)
MCH: 28.1 pg (ref 26.0–34.0)
MCHC: 32.9 g/dL (ref 30.0–36.0)
MCV: 85.4 fL (ref 80.0–100.0)
Platelets: 339 10*3/uL (ref 150–400)
RBC: 3.84 MIL/uL — ABNORMAL LOW (ref 4.22–5.81)
RDW: 12.3 % (ref 11.5–15.5)
WBC: 14.3 10*3/uL — ABNORMAL HIGH (ref 4.0–10.5)
nRBC: 0 % (ref 0.0–0.2)

## 2019-06-24 LAB — COMPREHENSIVE METABOLIC PANEL
ALT: 30 U/L (ref 0–44)
AST: 18 U/L (ref 15–41)
Albumin: 4.1 g/dL (ref 3.5–5.0)
Alkaline Phosphatase: 56 U/L (ref 38–126)
Anion gap: 11 (ref 5–15)
BUN: 27 mg/dL — ABNORMAL HIGH (ref 6–20)
CO2: 24 mmol/L (ref 22–32)
Calcium: 9.2 mg/dL (ref 8.9–10.3)
Chloride: 103 mmol/L (ref 98–111)
Creatinine, Ser: 0.97 mg/dL (ref 0.61–1.24)
GFR calc Af Amer: >60 mL/min (ref >60)
GFR calc non Af Amer: >60 mL/min (ref >60)
Glucose, Bld: 143 mg/dL — ABNORMAL HIGH (ref 70–99)
Potassium: 4 mmol/L (ref 3.5–5.1)
Sodium: 138 mmol/L (ref 135–145)
Total Bilirubin: 0.6 mg/dL (ref 0.3–1.2)
Total Protein: 7.7 g/dL (ref 6.5–8.1)

## 2019-06-24 LAB — LACTIC ACID, PLASMA
Lactic Acid, Venous: 2 mmol/L (ref 0.5–1.9)
Lactic Acid, Venous: 2.2 mmol/L (ref 0.5–1.9)

## 2019-06-24 LAB — GLUCOSE, CAPILLARY
Glucose-Capillary: 121 mg/dL — ABNORMAL HIGH (ref 70–99)
Glucose-Capillary: 107 mg/dL — ABNORMAL HIGH (ref 70–99)
Glucose-Capillary: 104 mg/dL — ABNORMAL HIGH (ref 70–99)

## 2019-06-24 LAB — RESPIRATORY PANEL BY RT PCR (FLU A&B, COVID)
Influenza A by PCR: NEGATIVE
Influenza B by PCR: NEGATIVE
SARS Coronavirus 2 by RT PCR: NEGATIVE

## 2019-06-24 LAB — TROPONIN I (HIGH SENSITIVITY)
Troponin I (High Sensitivity): 8 ng/L (ref <18)
Troponin I (High Sensitivity): 10 ng/L (ref <18)

## 2019-06-24 MED ORDER — VITAMIN D 25 MCG (1000 UNIT) PO TABS
1000.0000 [IU] | ORAL_TABLET | Freq: Every day | ORAL | Status: DC
  Administered 2019-06-24 – 2019-06-26 (×2): 1000 [IU] via ORAL
  Filled 2019-06-24 (×2): qty 1

## 2019-06-24 MED ORDER — INSULIN ASPART 100 UNIT/ML ~~LOC~~ SOLN
4.0000 [IU] | Freq: Three times a day (TID) | SUBCUTANEOUS | Status: DC
  Filled 2019-06-24: qty 1

## 2019-06-24 MED ORDER — INSULIN ASPART 100 UNIT/ML ~~LOC~~ SOLN
0.0000 [IU] | Freq: Three times a day (TID) | SUBCUTANEOUS | Status: DC
  Administered 2019-06-24 – 2019-06-25 (×2): 2 [IU] via SUBCUTANEOUS
  Filled 2019-06-24 (×2): qty 1

## 2019-06-24 MED ORDER — VANCOMYCIN HCL IN DEXTROSE 1-5 GM/200ML-% IV SOLN
1000.0000 mg | Freq: Once | INTRAVENOUS | Status: AC
  Administered 2019-06-24: 1000 mg via INTRAVENOUS
  Filled 2019-06-24: qty 200

## 2019-06-24 MED ORDER — SODIUM CHLORIDE 0.9 % IV SOLN: INTRAVENOUS | Status: DC

## 2019-06-24 MED ORDER — IOHEXOL 350 MG/ML SOLN
75.0000 mL | Freq: Once | INTRAVENOUS | Status: AC | PRN
  Administered 2019-06-24: 75 mL via INTRAVENOUS

## 2019-06-24 MED ORDER — SODIUM CHLORIDE 0.9 % IV SOLN: Freq: Once | INTRAVENOUS | Status: AC

## 2019-06-24 MED ORDER — PIPERACILLIN-TAZOBACTAM 3.375 G IVPB 30 MIN
3.3750 g | Freq: Once | INTRAVENOUS | Status: AC
  Administered 2019-06-24: 3.375 g via INTRAVENOUS
  Filled 2019-06-24: qty 50

## 2019-06-24 MED ORDER — ENOXAPARIN SODIUM 40 MG/0.4ML ~~LOC~~ SOLN: 40.0000 mg | SUBCUTANEOUS | Status: DC

## 2019-06-24 MED ORDER — PRAVASTATIN SODIUM 20 MG PO TABS
20.0000 mg | ORAL_TABLET | Freq: Every day | ORAL | Status: DC
  Administered 2019-06-24 – 2019-06-26 (×3): 20 mg via ORAL
  Filled 2019-06-24 (×4): qty 1

## 2019-06-24 MED ORDER — TIZANIDINE HCL 2 MG PO TABS
2.0000 mg | ORAL_TABLET | Freq: Four times a day (QID) | ORAL | Status: DC | PRN
  Filled 2019-06-24: qty 1

## 2019-06-24 MED ORDER — VITAMIN B-12 1000 MCG PO TABS
1000.0000 ug | ORAL_TABLET | Freq: Every day | ORAL | Status: DC
  Administered 2019-06-24 – 2019-06-26 (×2): 1000 ug via ORAL
  Filled 2019-06-24 (×2): qty 1

## 2019-06-24 MED ORDER — ACETAMINOPHEN 325 MG PO TABS
650.0000 mg | ORAL_TABLET | Freq: Four times a day (QID) | ORAL | Status: DC | PRN
  Administered 2019-06-25 – 2019-06-27 (×3): 650 mg via ORAL
  Filled 2019-06-24 (×3): qty 2

## 2019-06-24 NOTE — ED Notes (Signed)
Pt son at bedside visiting with pt. Pt has no needs at this time. Call light within reach.

## 2019-06-24 NOTE — ED Triage Notes (Signed)
Patient brought in by ems from home. Patient felt dizzy and fell times 5 this am. Patient denies hitting his head. Patient with skin tear to right elbow. Per ems report patient's hr went from 97 to 131 when he went from sitting to standing.

## 2019-06-24 NOTE — ED Notes (Signed)
Attempted to call report, per floor, they need 5 minutes for technical difficulties on their end. This RN left name and extension for callback

## 2019-06-24 NOTE — ED Notes (Signed)
Admitting MD at bedside.

## 2019-06-24 NOTE — ED Notes (Signed)
Pt transported to CT ?

## 2019-06-24 NOTE — ED Notes (Signed)
Pt given meal tray.

## 2019-06-24 NOTE — ED Notes (Signed)
This RN assisted pt to stand at bedside to use urinal. Pt HR increased to 150s and remained in the 150s. Pt stating he felt dizzy. Pt helped back in bed. MD made aware. Unable to collect urine sample at this time.

## 2019-06-24 NOTE — ED Notes (Addendum)
This RN updated pt's son, Rodman Key

## 2019-06-24 NOTE — ED Provider Notes (Signed)
Repeat EKG interpreted by me, sinus tachycardia with rate of 106 bpm, nonspecific ST segment changes, normal QT   Earleen Newport, MD 06/24/19 1202

## 2019-06-24 NOTE — ED Notes (Signed)
Assumed care of patient. Patient states has multiple syncope episodes today. No med changes noted. Denies cp/sob. Vss. Lunch provided. Patient tacky on monitor. Will continue to monitor.

## 2019-06-24 NOTE — ED Notes (Signed)
Date and time results received: 06/24/19 0857AM  (use smartphrase ".now" to insert current time)  Test: Lactic  Critical Value: 2.0  Name of Provider Notified: Dr. Jimmye Norman  Orders Received? Or Actions Taken?: No new orders at this time

## 2019-06-24 NOTE — ED Notes (Signed)
Attempted to call report, per floor they need a couple minutes to get sorted

## 2019-06-24 NOTE — ED Notes (Signed)
Pt ambulated to bathroom 
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
## 2019-06-24 NOTE — ED Notes (Signed)
Family at bedside. Dinner tray provided.
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
## 2019-06-25 ENCOUNTER — Encounter: Payer: Self-pay | Admitting: Internal Medicine

## 2019-06-25 ENCOUNTER — Inpatient Hospital Stay
Admit: 2019-06-25 | Discharge: 2019-06-25 | Disposition: A | Discharge status: Home / Self Care | Payer: PPO | Attending: Internal Medicine | Admitting: Internal Medicine

## 2019-06-25 ENCOUNTER — Observation Stay: Payer: PPO

## 2019-06-25 DIAGNOSIS — K5669 Other partial intestinal obstruction: Secondary | ICD-10-CM | POA: Diagnosis not present

## 2019-06-25 DIAGNOSIS — Y93G9 Activity, other involving cooking and grilling: Secondary | ICD-10-CM | POA: Diagnosis not present

## 2019-06-25 DIAGNOSIS — D72829 Elevated white blood cell count, unspecified: Secondary | ICD-10-CM | POA: Diagnosis not present

## 2019-06-25 DIAGNOSIS — I959 Hypotension, unspecified: Secondary | ICD-10-CM | POA: Diagnosis not present

## 2019-06-25 DIAGNOSIS — E872 Acidosis: Secondary | ICD-10-CM

## 2019-06-25 DIAGNOSIS — R1110 Vomiting, unspecified: Secondary | ICD-10-CM | POA: Diagnosis not present

## 2019-06-25 DIAGNOSIS — Z8042 Family history of malignant neoplasm of prostate: Secondary | ICD-10-CM | POA: Diagnosis not present

## 2019-06-25 DIAGNOSIS — D649 Anemia, unspecified: Secondary | ICD-10-CM | POA: Diagnosis not present

## 2019-06-25 DIAGNOSIS — R296 Repeated falls: Secondary | ICD-10-CM | POA: Diagnosis not present

## 2019-06-25 DIAGNOSIS — E785 Hyperlipidemia, unspecified: Secondary | ICD-10-CM | POA: Diagnosis not present

## 2019-06-25 DIAGNOSIS — Z8673 Personal history of transient ischemic attack (TIA), and cerebral infarction without residual deficits: Secondary | ICD-10-CM | POA: Diagnosis not present

## 2019-06-25 DIAGNOSIS — R55 Syncope and collapse: Secondary | ICD-10-CM | POA: Diagnosis not present

## 2019-06-25 DIAGNOSIS — R000 Tachycardia, unspecified: Secondary | ICD-10-CM | POA: Diagnosis not present

## 2019-06-25 DIAGNOSIS — E109 Type 1 diabetes mellitus without complications: Secondary | ICD-10-CM | POA: Diagnosis not present

## 2019-06-25 DIAGNOSIS — E119 Type 2 diabetes mellitus without complications: Secondary | ICD-10-CM | POA: Diagnosis not present

## 2019-06-25 DIAGNOSIS — R195 Other fecal abnormalities: Secondary | ICD-10-CM | POA: Diagnosis not present

## 2019-06-25 DIAGNOSIS — R112 Nausea with vomiting, unspecified: Secondary | ICD-10-CM | POA: Diagnosis not present

## 2019-06-25 DIAGNOSIS — Z886 Allergy status to analgesic agent status: Secondary | ICD-10-CM | POA: Diagnosis not present

## 2019-06-25 DIAGNOSIS — Z791 Long term (current) use of non-steroidal anti-inflammatories (NSAID): Secondary | ICD-10-CM | POA: Diagnosis not present

## 2019-06-25 DIAGNOSIS — Z8719 Personal history of other diseases of the digestive system: Secondary | ICD-10-CM | POA: Diagnosis not present

## 2019-06-25 DIAGNOSIS — Z833 Family history of diabetes mellitus: Secondary | ICD-10-CM | POA: Diagnosis not present

## 2019-06-25 DIAGNOSIS — H9202 Otalgia, left ear: Secondary | ICD-10-CM | POA: Diagnosis not present

## 2019-06-25 DIAGNOSIS — I10 Essential (primary) hypertension: Secondary | ICD-10-CM | POA: Diagnosis not present

## 2019-06-25 DIAGNOSIS — Z8249 Family history of ischemic heart disease and other diseases of the circulatory system: Secondary | ICD-10-CM | POA: Diagnosis not present

## 2019-06-25 DIAGNOSIS — W272XXA Contact with scissors, initial encounter: Secondary | ICD-10-CM | POA: Diagnosis present

## 2019-06-25 DIAGNOSIS — R42 Dizziness and giddiness: Secondary | ICD-10-CM | POA: Diagnosis not present

## 2019-06-25 DIAGNOSIS — S61215A Laceration without foreign body of left ring finger without damage to nail, initial encounter: Secondary | ICD-10-CM | POA: Diagnosis not present

## 2019-06-25 DIAGNOSIS — R042 Hemoptysis: Secondary | ICD-10-CM | POA: Diagnosis not present

## 2019-06-25 DIAGNOSIS — R200 Anesthesia of skin: Secondary | ICD-10-CM | POA: Diagnosis not present

## 2019-06-25 DIAGNOSIS — Z20822 Contact with and (suspected) exposure to covid-19: Secondary | ICD-10-CM | POA: Diagnosis not present

## 2019-06-25 DIAGNOSIS — K56600 Partial intestinal obstruction, unspecified as to cause: Secondary | ICD-10-CM | POA: Diagnosis not present

## 2019-06-25 DIAGNOSIS — Z823 Family history of stroke: Secondary | ICD-10-CM | POA: Diagnosis not present

## 2019-06-25 DIAGNOSIS — Z803 Family history of malignant neoplasm of breast: Secondary | ICD-10-CM | POA: Diagnosis not present

## 2019-06-25 DIAGNOSIS — Y92010 Kitchen of single-family (private) house as the place of occurrence of the external cause: Secondary | ICD-10-CM | POA: Diagnosis not present

## 2019-06-25 DIAGNOSIS — Z7901 Long term (current) use of anticoagulants: Secondary | ICD-10-CM | POA: Diagnosis not present

## 2019-06-25 LAB — BLOOD GAS, ARTERIAL
Acid-Base Excess: 0.3 mmol/L (ref 0.0–2.0)
Bicarbonate: 23.5 mmol/L (ref 20.0–28.0)
FIO2: 0.21
O2 Saturation: 97.1 %
Patient temperature: 37
pCO2 arterial: 33 mmHg (ref 32.0–48.0)
pH, Arterial: 7.46 — ABNORMAL HIGH (ref 7.350–7.450)
pO2, Arterial: 87 mmHg (ref 83.0–108.0)

## 2019-06-25 LAB — BASIC METABOLIC PANEL
Anion gap: 10 (ref 5–15)
Anion gap: 12 (ref 5–15)
BUN: 23 mg/dL — ABNORMAL HIGH (ref 6–20)
BUN: 20 mg/dL (ref 6–20)
CO2: 24 mmol/L (ref 22–32)
CO2: 23 mmol/L (ref 22–32)
Calcium: 8.7 mg/dL — ABNORMAL LOW (ref 8.9–10.3)
Calcium: 8.8 mg/dL — ABNORMAL LOW (ref 8.9–10.3)
Chloride: 109 mmol/L (ref 98–111)
Chloride: 109 mmol/L (ref 98–111)
Creatinine, Ser: 0.77 mg/dL (ref 0.61–1.24)
Creatinine, Ser: 0.87 mg/dL (ref 0.61–1.24)
GFR calc Af Amer: >60 mL/min (ref >60)
GFR calc Af Amer: >60 mL/min (ref >60)
GFR calc non Af Amer: >60 mL/min (ref >60)
GFR calc non Af Amer: >60 mL/min (ref >60)
Glucose, Bld: 134 mg/dL — ABNORMAL HIGH (ref 70–99)
Glucose, Bld: 156 mg/dL — ABNORMAL HIGH (ref 70–99)
Potassium: 3.3 mmol/L — ABNORMAL LOW (ref 3.5–5.1)
Potassium: 3.6 mmol/L (ref 3.5–5.1)
Sodium: 143 mmol/L (ref 135–145)
Sodium: 144 mmol/L (ref 135–145)

## 2019-06-25 LAB — CBC
HCT: 35.4 % — ABNORMAL LOW (ref 39.0–52.0)
Hemoglobin: 11.9 g/dL — ABNORMAL LOW (ref 13.0–17.0)
MCH: 27.9 pg (ref 26.0–34.0)
MCHC: 33.6 g/dL (ref 30.0–36.0)
MCV: 82.9 fL (ref 80.0–100.0)
Platelets: 391 10*3/uL (ref 150–400)
RBC: 4.27 MIL/uL (ref 4.22–5.81)
RDW: 12.7 % (ref 11.5–15.5)
WBC: 16.9 10*3/uL — ABNORMAL HIGH (ref 4.0–10.5)
nRBC: 0 % (ref 0.0–0.2)

## 2019-06-25 LAB — GLUCOSE, CAPILLARY
Glucose-Capillary: 110 mg/dL — ABNORMAL HIGH (ref 70–99)
Glucose-Capillary: 110 mg/dL — ABNORMAL HIGH (ref 70–99)
Glucose-Capillary: 114 mg/dL — ABNORMAL HIGH (ref 70–99)
Glucose-Capillary: 126 mg/dL — ABNORMAL HIGH (ref 70–99)
Glucose-Capillary: 126 mg/dL — ABNORMAL HIGH (ref 70–99)

## 2019-06-25 LAB — PROCALCITONIN: Procalcitonin: <0.1 ng/mL

## 2019-06-25 LAB — HEMOGLOBIN A1C
Hgb A1c MFr Bld: 6.3 % — ABNORMAL HIGH (ref 4.8–5.6)
Mean Plasma Glucose: 134.11 mg/dL

## 2019-06-25 LAB — TSH: TSH: 0.515 u[IU]/mL (ref 0.350–4.500)

## 2019-06-25 LAB — HIV ANTIBODY (ROUTINE TESTING W REFLEX): HIV Screen 4th Generation wRfx: NONREACTIVE

## 2019-06-25 MED ORDER — PROMETHAZINE HCL 25 MG/ML IJ SOLN
25.0000 mg | Freq: Four times a day (QID) | INTRAMUSCULAR | Status: DC | PRN
  Administered 2019-06-25: 25 mg via INTRAVENOUS
  Filled 2019-06-25: qty 1

## 2019-06-25 MED ORDER — MECLIZINE HCL 25 MG PO TABS
25.0000 mg | ORAL_TABLET | Freq: Three times a day (TID) | ORAL | Status: DC
  Administered 2019-06-25 – 2019-06-27 (×6): 25 mg via ORAL
  Filled 2019-06-25 (×9): qty 1

## 2019-06-25 MED ORDER — IOHEXOL 300 MG/ML  SOLN
100.0000 mL | Freq: Once | INTRAMUSCULAR | Status: AC | PRN
  Administered 2019-06-25: 100 mL via INTRAVENOUS

## 2019-06-25 MED ORDER — IOHEXOL 350 MG/ML SOLN
75.0000 mL | Freq: Once | INTRAVENOUS | Status: AC | PRN
  Administered 2019-06-25: 75 mL via INTRAVENOUS

## 2019-06-25 MED ORDER — ONDANSETRON HCL 4 MG/2ML IJ SOLN
4.0000 mg | Freq: Four times a day (QID) | INTRAMUSCULAR | Status: DC | PRN
  Administered 2019-06-25: 4 mg via INTRAVENOUS
  Filled 2019-06-25: qty 2
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
## 2019-06-25 NOTE — Progress Notes (Signed)
Paged Dr. Kurtis Bushman regarding patient having bouts of continuous vomitting, dizziness and vital sign changes with HR and BP.  She is ordering CT scan of abdomen and increasing IVF and making patient NPO for now.  Will continue to monitor.

## 2019-06-25 NOTE — Progress Notes (Addendum)
Called MD with results of CT abdomen.  MD aware of MEWS of 2.  She is consulting surgery for possible NG tube.

## 2019-06-25 NOTE — Progress Notes (Signed)
Chaplain responded to a rapid response page. Patient in distress. Patient was quickly taken care of by 2A staff and responding staff.

## 2019-06-25 NOTE — Progress Notes (Signed)
PROGRESS NOTE    Ronald Trujillo  [id number] DOB: [birth year] DOA: 06/24/2019 PCP: Adin Hector, MD    Brief Narrative:  Ronald Trujillo is a 59 y.o. male with medical history significant for anemia, arthritis, hypertension, CVA and dyslipidemia who was brought into the emergency room by EMS for evaluation of dizziness and multiple episodes of falls at home.  Patient is unable to tell me if he lost consciousness with these episodes. He is s/p recent tonsillectomy and states that he has been coughing up blood tinged phelgm. According to EMS they noted an increase in his heart rate anytime he went from a sitting to standing position, heart rate was increased to about 130.  He had a systolic blood pressure in the 90s upon arrival to the ER. Labs reveal a white count of 18,000, with a lactic acid level of 2.  Chest x-ray showed no acute findings and he has no pyuria.    Consultants:   Surgery  Procedures: ct  Antimicrobials:      Subjective: Patient had his eyes closed during the exam.  He reports not feeling well.  He has been vomiting all morning.  Nauseous.  Does report feeling his head is dizzy when he moves it around.  Objective: Vitals:   06/25/19 0400 06/25/19 0500 06/25/19 0600 06/25/19 0735  BP:    (!) 148/96  Pulse:    (!) 106  Resp: 16 16 17 18   Temp:    98.1 F (36.7 C)  TempSrc:    Oral  SpO2:    98%  Weight:      Height:        Intake/Output Summary (Last 24 hours) at 06/25/2019 0822 Last data filed at 06/25/2019 [id number] Gross per 24 hour  Intake 1868.04 ml  Output 2580 ml  Net -711.96 ml   Filed Weights   06/24/19 0656 06/24/19 2140 06/25/19 0314  Weight: 86.2 kg 81.8 kg 83.6 kg    Examination:  General exam: Appears calm and comfortable , looks tired. Eyes closed not interactive with exam Respiratory system: Clear to auscultation. Respiratory effort normal.anteriorly Cardiovascular system: S1 & S2 heard, RRR. No JVD, murmurs, rubs,  gallops or clicks.  Gastrointestinal system: Abdomen is nondistended, soft and nontender. +bs Central nervous system: Alert and oriented. Unable to assess full exam. Extremities: no edema Skin: Warm dry Psychiatry:Mood & affect appropriate in current setting.     Data Reviewed: I have personally reviewed following labs and imaging studies  CBC: Recent Labs  Lab 06/24/19 0657 06/24/19 2146  WBC 18.3* 14.3*  NEUTROABS 13.9*  --   HGB 12.4* 10.8*  HCT 38.0* 32.8*  MCV 86.6 85.4  PLT 408* [id number]   Basic Metabolic Panel: Recent Labs  Lab 06/24/19 0657 06/25/19 0516  NA 138 143  K 4.0 3.3*  CL 103 109  CO2 24 24  GLUCOSE 143* 134*  BUN 27* 23*  CREATININE 0.97 0.77  CALCIUM 9.2 8.7*   GFR: Estimated Creatinine Clearance: 98.2 mL/min (by C-G formula based on SCr of 0.77 mg/dL). Liver Function Tests: Recent Labs  Lab 06/24/19 0657  AST 18  ALT 30  ALKPHOS 56  BILITOT 0.6  PROT 7.7  ALBUMIN 4.1   No results for input(s): LIPASE, AMYLASE in the last 168 hours. No results for input(s): AMMONIA in the last 168 hours. Coagulation Profile: No results for input(s): INR, PROTIME in the last 168 hours. Cardiac Enzymes: No results for input(s): CKTOTAL, CKMB, CKMBINDEX, TROPONINI in  the last 168 hours. BNP (last 3 results) No results for input(s): PROBNP in the last 8760 hours. HbA1C: Recent Labs    06/24/19 2146  HGBA1C 6.3*   CBG: Recent Labs  Lab 06/24/19 1637 06/24/19 2054 06/24/19 2153 06/25/19 0039 06/25/19 0738  GLUCAP 121* 107* 104* 114* 126*   Lipid Profile: No results for input(s): CHOL, HDL, LDLCALC, TRIG, CHOLHDL, LDLDIRECT in the last 72 hours. Thyroid Function Tests: Recent Labs    06/25/19 0516  TSH 0.515   Anemia Panel: No results for input(s): VITAMINB12, FOLATE, FERRITIN, TIBC, IRON, RETICCTPCT in the last 72 hours. Sepsis Labs: Recent Labs  Lab 06/24/19 0813 06/25/19 0516  PROCALCITON  --  <0.10  LATICACIDVEN 2.2*  2.0*  --      Recent Results (from the past 240 hour(s))  Blood culture (routine x 2)     Status: None (Preliminary result)   Collection Time: 06/24/19  8:13 AM   Specimen: BLOOD RIGHT ARM  Result Value Ref Range Status   Specimen Description BLOOD RIGHT ARM  Final   Special Requests   Final    BOTTLES DRAWN AEROBIC AND ANAEROBIC Blood Culture adequate volume   Culture   Final    NO GROWTH < 24 HOURS Performed at [facility], [address] [place], [place] [address]    Report Status PENDING  Incomplete  Blood culture (routine x 2)     Status: None (Preliminary result)   Collection Time: 06/24/19  8:14 AM   Specimen: BLOOD  Result Value Ref Range Status   Specimen Description BLOOD BLOOD LEFT FOREARM  Final   Special Requests   Final    BOTTLES DRAWN AEROBIC AND ANAEROBIC Blood Culture adequate volume   Culture   Final    NO GROWTH < 24 HOURS Performed at [facility], [address] [place], [place] [address]    Report Status PENDING  Incomplete  Respiratory Panel by RT PCR (Flu A&B, Covid) -     Status: None   Collection Time: 06/24/19 11:41 AM  Result Value Ref Range Status   SARS Coronavirus 2 by RT PCR NEGATIVE NEGATIVE Final    Comment: (NOTE) SARS-CoV-2 target nucleic acids are NOT DETECTED. The SARS-CoV-2 RNA is generally detectable in upper respiratoy specimens during the acute phase of infection. The lowest concentration of SARS-CoV-2 viral copies this assay can detect is 131 copies/mL. A negative result does not preclude SARS-Cov-2 infection and should not be used as the sole basis for treatment or other patient management decisions. A negative result may occur with  improper specimen collection/handling, submission of specimen other than nasopharyngeal swab, presence of viral mutation(s) within the areas targeted by this assay, and inadequate number of viral copies (<131 copies/mL). A negative result must be combined with clinical observations,  patient history, and epidemiological information. The expected result is Negative. Fact Sheet for Patients:  PinkCheek.be Fact Sheet for Healthcare Providers:  GravelBags.it This test is not yet ap proved or cleared by the [place] FDA and  has been authorized for detection and/or diagnosis of SARS-CoV-2 by FDA under an Emergency Use Authorization (EUA). This EUA will remain  in effect (meaning this test can be used) for the duration of the COVID-19 declaration under Section 564(b)(1) of the Act, 21 U.S.C. section 360bbb-3(b)(1), unless the authorization is terminated or revoked sooner.    Influenza A by PCR NEGATIVE NEGATIVE Final   Influenza B by PCR NEGATIVE NEGATIVE Final    Comment: (NOTE)  The Xpert Xpress SARS-CoV-2/FLU/RSV assay is intended as an aid in  the diagnosis of influenza from Nasopharyngeal swab specimens and  should not be used as a sole basis for treatment. Nasal washings and  aspirates are unacceptable for Xpert Xpress SARS-CoV-2/FLU/RSV  testing. Fact Sheet for Patients: PinkCheek.be Fact Sheet for Healthcare Providers: GravelBags.it This test is not yet approved or cleared by the [place] FDA and  has been authorized for detection and/or diagnosis of SARS-CoV-2 by  FDA under an Emergency Use Authorization (EUA). This EUA will remain  in effect (meaning this test can be used) for the duration of the  Covid-19 declaration under Section 564(b)(1) of the Act, 21  U.S.C. section 360bbb-3(b)(1), unless the authorization is  terminated or revoked. Performed at [facility], [address] [place], [place] [address]          Radiology Studies: CT ANGIO HEAD W OR WO CONTRAST  Result Date: 06/25/2019 CLINICAL DATA:  Initial evaluation for acute right-sided numbness. EXAM: CT ANGIOGRAPHY HEAD AND NECK TECHNIQUE: Multidetector CT  imaging of the head and neck was performed using the standard protocol during bolus administration of intravenous contrast. Multiplanar CT image reconstructions and MIPs were obtained to evaluate the vascular anatomy. Carotid stenosis measurements (when applicable) are obtained utilizing NASCET criteria, using the distal internal carotid diameter as the denominator. CONTRAST:  67mL OMNIPAQUE IOHEXOL 350 MG/ML SOLN COMPARISON:  Prior head CT from 06/24/2019 FINDINGS: CT HEAD FINDINGS Brain: Atrophy with chronic small vessel ischemic disease, stable. Encephalomalacia at the parasagittal anterior left frontal lobe noted, consistent with a chronic left ACA territory infarct. No acute intracranial hemorrhage. No acute large vessel territory infarct. No mass lesion, midline shift or mass effect. No hydrocephalus or extra-axial fluid collection. Vascular: No hyperdense vessel. Skull: Scalp soft tissues and calvarium demonstrate no acute finding. Sinuses: Paranasal sinuses mastoid air cells are clear. Orbits: Globes and orbital soft tissues demonstrate no acute finding. Visualized aortic arch of normal caliber with normal 3 vessel morphology. No hemodynamically significant stenosis seen about the origin of the great vessels. Subclavian arteries widely patent. CTA NECK FINDINGS Aortic arch: Visualized aortic arch of normal caliber with normal 3 vessel morphology. No flow-limiting stenosis about the origin of the great vessels. Visualized subclavian arteries widely patent. Right carotid system: Right common and internal carotid arteries widely patent without stenosis, dissection, or occlusion. Left carotid system: Left common and internal carotid arteries widely patent without stenosis, dissection, or occlusion. Vertebral arteries: Both vertebral arteries arise from the subclavian arteries. Vertebral arteries patent within the neck without occlusion or other abnormality. Skeleton: No acute osseous finding. No discrete or  worrisome osseous lesions. Other neck: No other acute soft tissue abnormality within the neck. Postsurgical changes present within the right neck. No mass lesion or adenopathy. Upper chest: Visualized upper chest demonstrates no acute finding. Review of the MIP images confirms the above findings CTA HEAD FINDINGS Anterior circulation: Both internal carotid arteries widely patent to the termini without stenosis. A1 segments widely patent. Normal anterior communicating artery. Anterior cerebral arteries widely patent to their distal aspects. No M1 stenosis or occlusion. Negative MCA bifurcations. Distal MCA branches well perfused and symmetric. Posterior circulation: Both vertebral arteries patent to the vertebrobasilar junction without stenosis. Left vertebral artery dominant. Both picas patent. Basilar patent to its distal aspect without stenosis. Superior cerebellar and posterior cerebral arteries widely patent bilaterally. Small bilateral posterior communicating arteries noted. Venous sinuses: Patent. Anatomic variants: None significant.  No intracranial aneurysm. Review of  the MIP images confirms the above findings IMPRESSION: CT HEAD IMPRESSION: 1. Stable head CT.  No acute intracranial abnormality identified. 2. Chronic left ACA territory infarct. 3. Underlying atrophy with chronic small vessel ischemic disease. CTA HEAD AND NECK IMPRESSION: Negative CTA of the head and neck. No large vessel occlusion, hemodynamically significant stenosis, or other acute vascular abnormality. Electronically Signed   By: Jeannine Boga M.D.   On: 06/25/2019 02:57   DG Chest 1 View  Result Date: 06/24/2019 CLINICAL DATA:  59 year old male with dizziness, multiple falls, syncope. EXAM: CHEST  1 VIEW COMPARISON:  Portable chest 09/19/2018. FINDINGS: Portable AP semi upright view at 0719 hours. Stable lung volumes. Mediastinal contours remain normal. Visualized tracheal air column is within normal limits. Allowing for  portable technique the lungs are clear. No pneumothorax or pleural effusion. Negative visible bowel gas. No acute osseous abnormality identified. IMPRESSION: Negative portable chest. Electronically Signed   By: Genevie Ann M.D.   On: 06/24/2019 07:58   CT HEAD WO CONTRAST  Result Date: 06/24/2019 CLINICAL DATA:  Weakness, dizziness EXAM: CT HEAD WITHOUT CONTRAST TECHNIQUE: Contiguous axial images were obtained from the base of the skull through the vertex without intravenous contrast. COMPARISON:  03/04/2014 FINDINGS: Brain: No evidence of acute infarction, hemorrhage, extra-axial collection, ventriculomegaly, or mass effect. Left frontal encephalomalacia. Generalized cerebral atrophy. Periventricular white matter low attenuation likely secondary to microangiopathy. Vascular: Cerebrovascular atherosclerotic calcifications are noted. Skull: Negative for fracture or focal lesion. Sinuses/Orbits: Visualized portions of the orbits are unremarkable. Visualized portions of the paranasal sinuses are unremarkable. Visualized portions of the mastoid air cells are unremarkable. Other: None. IMPRESSION: 1. No acute intracranial pathology. 2. Chronic microvascular disease and cerebral atrophy. Electronically Signed   By: Kathreen Devoid   On: 06/24/2019 13:35   CT ANGIO NECK W OR WO CONTRAST  Result Date: 06/25/2019 CLINICAL DATA:  Initial evaluation for acute right-sided numbness. EXAM: CT ANGIOGRAPHY HEAD AND NECK TECHNIQUE: Multidetector CT imaging of the head and neck was performed using the standard protocol during bolus administration of intravenous contrast. Multiplanar CT image reconstructions and MIPs were obtained to evaluate the vascular anatomy. Carotid stenosis measurements (when applicable) are obtained utilizing NASCET criteria, using the distal internal carotid diameter as the denominator. CONTRAST:  22mL OMNIPAQUE IOHEXOL 350 MG/ML SOLN COMPARISON:  Prior head CT from 06/24/2019 FINDINGS: CT HEAD FINDINGS  Brain: Atrophy with chronic small vessel ischemic disease, stable. Encephalomalacia at the parasagittal anterior left frontal lobe noted, consistent with a chronic left ACA territory infarct. No acute intracranial hemorrhage. No acute large vessel territory infarct. No mass lesion, midline shift or mass effect. No hydrocephalus or extra-axial fluid collection. Vascular: No hyperdense vessel. Skull: Scalp soft tissues and calvarium demonstrate no acute finding. Sinuses: Paranasal sinuses mastoid air cells are clear. Orbits: Globes and orbital soft tissues demonstrate no acute finding. Visualized aortic arch of normal caliber with normal 3 vessel morphology. No hemodynamically significant stenosis seen about the origin of the great vessels. Subclavian arteries widely patent. CTA NECK FINDINGS Aortic arch: Visualized aortic arch of normal caliber with normal 3 vessel morphology. No flow-limiting stenosis about the origin of the great vessels. Visualized subclavian arteries widely patent. Right carotid system: Right common and internal carotid arteries widely patent without stenosis, dissection, or occlusion. Left carotid system: Left common and internal carotid arteries widely patent without stenosis, dissection, or occlusion. Vertebral arteries: Both vertebral arteries arise from the subclavian arteries. Vertebral arteries patent within the neck without occlusion or other abnormality. Skeleton:  No acute osseous finding. No discrete or worrisome osseous lesions. Other neck: No other acute soft tissue abnormality within the neck. Postsurgical changes present within the right neck. No mass lesion or adenopathy. Upper chest: Visualized upper chest demonstrates no acute finding. Review of the MIP images confirms the above findings CTA HEAD FINDINGS Anterior circulation: Both internal carotid arteries widely patent to the termini without stenosis. A1 segments widely patent. Normal anterior communicating artery. Anterior  cerebral arteries widely patent to their distal aspects. No M1 stenosis or occlusion. Negative MCA bifurcations. Distal MCA branches well perfused and symmetric. Posterior circulation: Both vertebral arteries patent to the vertebrobasilar junction without stenosis. Left vertebral artery dominant. Both picas patent. Basilar patent to its distal aspect without stenosis. Superior cerebellar and posterior cerebral arteries widely patent bilaterally. Small bilateral posterior communicating arteries noted. Venous sinuses: Patent. Anatomic variants: None significant.  No intracranial aneurysm. Review of the MIP images confirms the above findings IMPRESSION: CT HEAD IMPRESSION: 1. Stable head CT.  No acute intracranial abnormality identified. 2. Chronic left ACA territory infarct. 3. Underlying atrophy with chronic small vessel ischemic disease. CTA HEAD AND NECK IMPRESSION: Negative CTA of the head and neck. No large vessel occlusion, hemodynamically significant stenosis, or other acute vascular abnormality. Electronically Signed   By: Jeannine Boga M.D.   On: 06/25/2019 02:57   CT ANGIO CHEST PE W OR WO CONTRAST  Result Date: 06/24/2019 CLINICAL DATA:  Dizziness and weakness. Multiple falls. Denies chest pain or shortness of breath. EXAM: CT ANGIOGRAPHY CHEST WITH CONTRAST TECHNIQUE: Multidetector CT imaging of the chest was performed using the standard protocol during bolus administration of intravenous contrast. Multiplanar CT image reconstructions and MIPs were obtained to evaluate the vascular anatomy. CONTRAST:  84mL OMNIPAQUE IOHEXOL 350 MG/ML SOLN COMPARISON:  Chest x-ray from same day. PET-CT dated May 15, 2019. FINDINGS: Cardiovascular: Satisfactory opacification of the pulmonary arteries to the segmental level. No evidence of pulmonary embolism. Normal heart size. No pericardial effusion. No thoracic aortic aneurysm or dissection. Mediastinum/Nodes: No enlarged mediastinal, hilar, or axillary lymph  nodes. Thyroid gland, trachea, and esophagus demonstrate no significant findings. Lungs/Pleura: No focal consolidation, pleural effusion, or pneumothorax. No suspicious pulmonary nodule. A few small scattered simple thin-walled cysts in both lungs again noted. Upper Abdomen: No acute abnormality. Unchanged hepatic steatosis. Musculoskeletal: No chest wall abnormality. No acute or significant osseous findings. Review of the MIP images confirms the above findings. IMPRESSION: 1. No evidence of pulmonary embolism. No acute intrathoracic process. 2. Unchanged hepatic steatosis. Electronically Signed   By: Titus Dubin M.D.   On: 06/24/2019 13:35        Scheduled Meds: . cholecalciferol  1,000 Units Oral Q1500  . insulin aspart  0-15 Units Subcutaneous TID WC  . pravastatin  20 mg Oral q1800  . cyanocobalamin  1,000 mcg Oral Q1500   Continuous Infusions: . sodium chloride 125 mL/hr at 06/25/19 [id number]    Assessment & Plan:   Active Problems:   Near syncope   Lactic acidosis   Type 2 diabetes mellitus without complication (HCC)   Near syncope Secondary to orthostatic blood pressure changes which may be due to possible occult blood loss Patient is S/p recent tonsillectomy and states that he has been spitting up blood. BUN is elevated consistent with recent bleeding. Patient was hypotensive upon arrival to the emergency room systolic blood pressure in the 90s Continue hold lisinopril and propranolol IV fluid hydration, increase 250 mL's per hour Obtain 2D echocardiogram to assess LVEF  Monitor labs Telemetry   Lactic acidosis Patient has an elevated lactate level, unlikely to be infectious at this time since there is no obvious source Patient is on Metformin for glycemic control which can cause elevated lactate levels Prolactin less than 0.10 Continue IV fluid hydration   Diabetes mellitus Maintain consistent carbohydrate diet Glycemic control   Possible sepsis (POA)-to  rule out As evidenced by hypotension, tachycardia and leukocytosis present on admission No clear source at this time Chest x-ray shows no findings and UA is sterile   Leukocytosis- reactive v.s. infection. See above.  Secondary to bleeding Will monitor H/H  Vomiting- obtained CT A/P.Marland Kitchen>early or partial small bowel obstruction without obvious cause. Possible adhesions.+diverticulosis. Consulted GSX. Rec. Placing NGT and they will f/u.  DVT prophylaxis: SCD Code Status: Full Family Communication: none at bedside Disposition Plan: Back to previous home environment Barrier: Patient with small bowel obstruction, needing NG tube.  General surgery consulted.  Echo pending.  Still NPO.  Will likely be here tomorrow night.      LOS: 0 days   Time spent: 45 minutes with more than 50% COC    Nolberto Hanlon, MD [facility] Pager [phone]  If 7PM-7AM, please contact night-coverage [email or web address] Password [email or web address] 06/25/2019, 8:22 AM

## 2019-06-25 NOTE — Progress Notes (Signed)
Pt returned from [place], on call provider notified

## 2019-06-25 NOTE — Significant Event (Signed)
Rapid Response Event Note  Overview: Time Called: 1237 Arrival Time: 1239 Event Type: Neurologic  Initial Focused Assessment: RR called for patient with decreased LOC and syncope with standing to BSC. Bedside RN reports pt is more lethargic now than when he was admitted from ER around 2130.    Patient is responsive to voice and answers questions appropriately.  Appears lethargic. No acute respiratory distress. No complaints of pain.  Interventions: Orthostatic vitals while laying and sitting in bed obtained with no acute changes.  Results consistent with initial presentation to ER.  CBG 114.  ABG and EKG ordered and obtained.  NP at bedside to assess patient.  Focused neuro exam performed revealing slight right sided weakness/numbness, which is new from bedside RN's initial exam.  Plan of Care (if not transferred): STAT CTA Head and Neck ordered by NP for now, with possible MRI later today.  Bedside RN to call if further assistance needed. Will follow up with patient after CT.  Event Summary: Name of Physician Notified: Rufina Falco, NP at 1251  Outcome: Stayed in room and stabalized  Event End Time: 0115  Zettie Pho

## 2019-06-25 NOTE — Progress Notes (Signed)
RT X4 responded to Rapid Response. ABG obtained per order. No critical values to report. Patient SAT 100% on Room Air. Will continue to monitor.
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
## 2019-06-25 NOTE — Progress Notes (Signed)
Pt transported to CT ?

## 2019-06-26 DIAGNOSIS — E109 Type 1 diabetes mellitus without complications: Secondary | ICD-10-CM

## 2019-06-26 LAB — COMPREHENSIVE METABOLIC PANEL
ALT: 25 U/L (ref 0–44)
AST: 19 U/L (ref 15–41)
Albumin: 3.5 g/dL (ref 3.5–5.0)
Alkaline Phosphatase: 46 U/L (ref 38–126)
Anion gap: 8 (ref 5–15)
BUN: 12 mg/dL (ref 6–20)
CO2: 24 mmol/L (ref 22–32)
Calcium: 8.4 mg/dL — ABNORMAL LOW (ref 8.9–10.3)
Chloride: 111 mmol/L (ref 98–111)
Creatinine, Ser: 0.72 mg/dL (ref 0.61–1.24)
GFR calc Af Amer: >60 mL/min (ref >60)
GFR calc non Af Amer: >60 mL/min (ref >60)
Glucose, Bld: 136 mg/dL — ABNORMAL HIGH (ref 70–99)
Potassium: 3.7 mmol/L (ref 3.5–5.1)
Sodium: 143 mmol/L (ref 135–145)
Total Bilirubin: 1.3 mg/dL — ABNORMAL HIGH (ref 0.3–1.2)
Total Protein: 6.2 g/dL — ABNORMAL LOW (ref 6.5–8.1)

## 2019-06-26 LAB — GLUCOSE, CAPILLARY
Glucose-Capillary: 107 mg/dL — ABNORMAL HIGH (ref 70–99)
Glucose-Capillary: 105 mg/dL — ABNORMAL HIGH (ref 70–99)
Glucose-Capillary: 88 mg/dL (ref 70–99)
Glucose-Capillary: 106 mg/dL — ABNORMAL HIGH (ref 70–99)

## 2019-06-26 LAB — CBC
HCT: 30.7 % — ABNORMAL LOW (ref 39.0–52.0)
Hemoglobin: 9.8 g/dL — ABNORMAL LOW (ref 13.0–17.0)
MCH: 27.7 pg (ref 26.0–34.0)
MCHC: 31.9 g/dL (ref 30.0–36.0)
MCV: 86.7 fL (ref 80.0–100.0)
Platelets: 301 10*3/uL (ref 150–400)
RBC: 3.54 MIL/uL — ABNORMAL LOW (ref 4.22–5.81)
RDW: 12.6 % (ref 11.5–15.5)
WBC: 11.7 10*3/uL — ABNORMAL HIGH (ref 4.0–10.5)
nRBC: 0 % (ref 0.0–0.2)

## 2019-06-26 LAB — ECHOCARDIOGRAM COMPLETE
Height: 69 in
Weight: 2947.2 oz

## 2019-06-26 LAB — PROCALCITONIN: Procalcitonin: <0.1 ng/mL

## 2019-06-26 LAB — OCCULT BLOOD X 1 CARD TO LAB, STOOL: Fecal Occult Bld: POSITIVE — AB

## 2019-06-26 MED ORDER — TRAMADOL HCL 50 MG PO TABS
50.0000 mg | ORAL_TABLET | Freq: Four times a day (QID) | ORAL | Status: DC | PRN
  Administered 2019-06-26 – 2019-06-27 (×3): 50 mg via ORAL
  Filled 2019-06-26 (×3): qty 1

## 2019-06-26 NOTE — Plan of Care (Signed)
  Problem: Clinical Measurements: Goal: Will remain free from infection 06/26/2019 0303 by Jeri Cos, RN Outcome: Progressing 06/26/2019 0302 by Jeri Cos, RN Outcome: Progressing   Problem: Clinical Measurements: Goal: Will remain free from infection 06/26/2019 0303 by Jeri Cos, RN Outcome: Progressing 06/26/2019 0302 by Jeri Cos, RN Outcome: Progressing

## 2019-06-26 NOTE — Progress Notes (Signed)
[facility] Day(s): 1.   Post op day(s):  Marland Kitchen   Interval History: Patient seen and examined, no acute events or new complaints overnight. Patient reports has improved from abdominal pain and nausea but now with significant headache.  Has not had any more vomiting since yesterday morning.  Reported having 2 small bowel movement but unable to say if he passed gas.  Patient without fever and significant improvement in leukocytosis.  Vital signs in last 24 hours: [min-max] current  Temp:  [97.8 F (36.6 C)-98.5 F (36.9 C)] 98.1 F (36.7 C) (04/21 1511) Pulse Rate:  [80-97] 84 (04/21 1511) Resp:  [14-19] 18 (04/21 1511) BP: (132-155)/(81-90) 132/83 (04/21 1511) SpO2:  [95 %-99 %] 99 % (04/21 1511) Weight:  [81.2 kg] 81.2 kg (04/21 0321)     Height: 5\' 9"  (175.3 cm) Weight: 81.2 kg BMI (Calculated): 26.44   Physical Exam:  Constitutional: alert, cooperative and no distress  Respiratory: breathing non-labored at rest  Cardiovascular: regular rate and sinus rhythm  Gastrointestinal: soft, non-tender, and non-distended  Labs:  CBC Latest Ref Rng & Units 06/26/2019 06/25/2019 06/24/2019  WBC 4.0 - 10.5 K/uL 11.7(H) 16.9(H) 14.3(H)  Hemoglobin 13.0 - 17.0 g/dL 9.8(L) 11.9(L) 10.8(L)  Hematocrit 39.0 - 52.0 % 30.7(L) 35.4(L) 32.8(L)  Platelets 150 - 400 K/uL 301 391 339   CMP Latest Ref Rng & Units 06/26/2019 06/25/2019 06/25/2019  Glucose 70 - 99 mg/dL 136(H) 156(H) 134(H)  BUN 6 - 20 mg/dL 12 20 23(H)  Creatinine 0.61 - 1.24 mg/dL 0.72 0.87 0.77  Sodium 135 - 145 mmol/L 143 144 143  Potassium 3.5 - 5.1 mmol/L 3.7 3.6 3.3(L)  Chloride 98 - 111 mmol/L 111 109 109  CO2 22 - 32 mmol/L 24 23 24   Calcium 8.9 - 10.3 mg/dL 8.4(L) 8.8(L) 8.7(L)  Total Protein 6.5 - 8.1 g/dL 6.2(L) - -  Total Bilirubin 0.3 - 1.2 mg/dL 1.3(H) - -  Alkaline Phos 38 - 126 U/L 46 - -  AST 15 - 41 U/L 19 - -  ALT 0 - 44 U/L 25 - -    Imaging studies: No new pertinent imaging  studies   Assessment/Plan:  59 y.o. male with CT scan finding of early bowel obstruction, complicated by pertinent comorbidities including complicated history of near syncope of unknown reasons, hypertension, hyperlipidemia, migraines, stroke, among others.  Patient with improved nausea.  Will try clear liquids today.  Will assess for toleration.  Again I think that the nausea and vomiting yesterday was coming from his vasovagal episodes.  Abdomen is benign.  No need for surgical management at this moment.  We will continue to follow to assist in the management of this patient.  Arnold Long, MD

## 2019-06-26 NOTE — Plan of Care (Signed)
?  Problem: Clinical Measurements: ?Goal: Will remain free from infection ?Outcome: Progressing ?  ?

## 2019-06-26 NOTE — Progress Notes (Signed)
Patient reporting headache 8/10 and left ear pain. No otoscope on unit available for assessment. Patient reports he would like to try something stronger than tylenol as it didn't seem to help enough earlier this shift. Paged Dr. Aileen Fass.

## 2019-06-26 NOTE — Consult Note (Signed)
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 structure. Aortic valve regurgitation is not visualized. No aortic stenosis is present.  5. The inferior vena cava is normal in size with greater than 50% respiratory variability, suggesting right atrial pressure of 3 mmHg. FINDINGS  Left Ventricle: Left ventricular ejection fraction, by estimation, is 60 to 65%. The left ventricle has normal function. The left ventricle has no regional wall motion abnormalities. The left ventricular internal cavity size was normal in size. There is  no left ventricular hypertrophy. Left ventricular diastolic parameters are consistent with Grade I diastolic dysfunction (impaired relaxation). Right Ventricle: The right ventricular size is normal. No increase in right ventricular wall thickness. Right ventricular systolic function is normal. Left Atrium: Left atrial size was normal in size. Right Atrium: Right atrial size was normal in size. Pericardium: There is no evidence of pericardial effusion. Mitral Valve: The mitral valve is normal in structure. Normal mobility of the mitral valve leaflets. Trivial mitral valve regurgitation. No evidence of  mitral valve stenosis. Tricuspid Valve: The tricuspid valve is normal in structure. Tricuspid valve regurgitation is trivial. No evidence of tricuspid  stenosis. Aortic Valve: The aortic valve is normal in structure. Aortic valve regurgitation is not visualized. No aortic stenosis is present. Pulmonic Valve: The pulmonic valve was normal in structure. Pulmonic valve regurgitation is trivial. No evidence of pulmonic stenosis. Aorta: The aortic root is normal in size and structure. Venous: The inferior vena cava is normal in size with greater than 50% respiratory variability, suggesting right atrial pressure of 3 mmHg. IAS/Shunts: No atrial level shunt detected by color flow Doppler.  LEFT VENTRICLE PLAX 2D LVIDd:         4.00 cm  Diastology LVIDs:         2.52 cm  LV e' lateral:   10.90 cm/s LV PW:         1.03 cm  LV E/e' lateral: 7.2 LV IVS:        1.17 cm  LV e' medial:    5.55 cm/s LVOT diam:     2.10 cm  LV E/e' medial:  14.1 LVOT Area:     3.46 cm  RIGHT VENTRICLE RV Basal diam:  4.21 cm RV S prime:     16.80 cm/s TAPSE (M-mode): 3.0 cm LEFT ATRIUM           Index       RIGHT ATRIUM           Index LA diam:      3.70 cm 1.85 cm/m  RA Area:     12.90 cm LA Vol (A2C): 31.4 ml 15.74 ml/m RA Volume:   31.50 ml  15.79 ml/m LA Vol (A4C): 52.6 ml 26.37 ml/m   AORTA Ao Root diam: 3.50 cm Ao Asc diam:  4.10 cm MITRAL VALVE MV Area (PHT): 2.32 cm    SHUNTS MV Decel Time: 327 msec    Systemic Diam: 2.10 cm MV E velocity: 78.20 cm/s MV A velocity: 97.90 cm/s MV E/A ratio:  0.80 Neoma Laming MD Electronically signed by Neoma Laming MD Signature Date/Time: 06/26/2019/9:12:12 AM    Final    @IMAGES @  Assessment: 1. Early vs. Partial small bowel obstruction. Patient does not appear clinically obstructed. Possibly mild ileus related to viral infection. 2. "spitting up" of blood. Not hemodynamically significant. Likely related to mild postop bleeding from recent tonsillectomy. 3. Anemia, mild. 4. Heme positive  stool.  5. Hx of sigmoid diverticulosis and anal skin tags in Jan 2021 on colonoscopy. Dr. Edison Nasuti, [facility]. 6. Syncope - Question possible URI, otitis with accompanying vertigo. Syncope not thought to be from GI blood loss given miniscule amount of presumed soft tissue throat bleeding.   COVID-19 status: Tested negative  Recommendations:  1. Clear liquid diet as tolerated.  2. Empiric Acid suppression for throat irritation/possible atypical GERD. 3. Anemia is not significant and no bleeding presently. Advise outpatient workup if tolerates po diet. My group can see in follow up and will assist in scheduling a visit at my office. 4. Follow up with ENT physician about recent throat irritation and bloody saliva. 5. Will follow peripherally. Further recs on ileus per surgery. 6. Resume anticoagulation OK from a GI standpoint.  Thank you for the consult. Please call with questions or concerns.  Olean Ree, "Lanny Hurst MD [facility] [address], [place] [address] [phone]  06/26/2019 12:55 PM

## 2019-06-26 NOTE — Progress Notes (Signed)
[facility] PROGRESS NOTE    Progress Note  Ronald Trujillo  [id number] DOB: June 18, 1960 DOA: 06/24/2019 PCP: Adin Hector, MD     Brief Narrative:   Ronald Trujillo is an 59 y.o. male past medical history significant of anemia arthritis hypertension was brought into the emergency room by EMS for evaluation of multiple episodes of fall at home and dizziness.  Patient does not recall if she has lost consciousness during these episodes.  She status post a tonsillectomy and have been having hematemesis.  EMS noticed tachycardia upon standing and a drop in systolic blood pressure to 90 in the ER.  With a white blood cell count of 18,000 lactic acid of 2  Assessment/Plan:   Near syncope Was hypotensive on arrival with a blood pressure of 90 in the ED. Antihypertensive medication lisinopril and propranolol were held on admission. Orthostatics were positive on admission, she was started on IV fluids, BP has improved. 2D echo done at the time of this dictation. Imaging showed no acute findings.  Lactic acidosis has resolved, procalcitonin less than 0.1 she has remained afebrile leukocytosis improving. Hbg has drop from 13.0 to 9.8 after hydration, he relates black stool, not sure if melena. On plavix had a recent colonoscopy. He is unsure whether he has had hematemesis, he is on Plavix and had a recent tonsillectomy which can make him prone to bleeding. We will consult GI. Patient n.p.o. except for meds for possible endoscopy.  Metabolic acidosis/lactic acidosis: Likely due to hypoperfusion, procalcitonin is reassuring. Continue IV fluids.  Type 2 diabetes mellitus without complication (HCC) Metformin held on admission she is currently on sliding scale fairly controlled.  Incidental partial small bowel obstruction: The patient was vomiting on 06/25/2019. General Surgery was consulted recommended to continue conservative management, recommended not to place an NG  tube. The patient related that he did had a bowel movement on 06/25/2019 passing gas. Surgery recommended conservative management.  DVT prophylaxis: SCD Family Communication:none Status is: Inpatient  Remains inpatient appropriate because:Hemodynamically unstable   Dispo: The patient is from: Home              Anticipated d/c is to: Home              Anticipated d/c date is: 2 days              Patient currently is not medically stable to d/c.   Code Status:     Code Status Orders  (From admission, onward)         Start     Ordered   06/24/19 1225  Full code  Continuous     06/24/19 1228        Code Status History    Date Active Date Inactive Code Status Order ID Comments User Context   12/07/2016 1417 12/07/2016 1758 Full Code [id number]  PoggiMarshall Cork, MD Inpatient   Advance Care Planning Activity       IV Access:    Peripheral IV   Procedures and diagnostic studies:   CT ANGIO HEAD W OR WO CONTRAST  Result Date: 06/25/2019 CLINICAL DATA:  Initial evaluation for acute right-sided numbness. EXAM: CT ANGIOGRAPHY HEAD AND NECK TECHNIQUE: Multidetector CT imaging of the head and neck was performed using the standard protocol during bolus administration of intravenous contrast. Multiplanar CT image reconstructions and MIPs were obtained to evaluate the vascular anatomy. Carotid stenosis measurements (when applicable) are obtained utilizing NASCET criteria, using the distal internal carotid  diameter as the denominator. CONTRAST:  51mL OMNIPAQUE IOHEXOL 350 MG/ML SOLN COMPARISON:  Prior head CT from 06/24/2019 FINDINGS: CT HEAD FINDINGS Brain: Atrophy with chronic small vessel ischemic disease, stable. Encephalomalacia at the parasagittal anterior left frontal lobe noted, consistent with a chronic left ACA territory infarct. No acute intracranial hemorrhage. No acute large vessel territory infarct. No mass lesion, midline shift or mass effect. No hydrocephalus or extra-axial  fluid collection. Vascular: No hyperdense vessel. Skull: Scalp soft tissues and calvarium demonstrate no acute finding. Sinuses: Paranasal sinuses mastoid air cells are clear. Orbits: Globes and orbital soft tissues demonstrate no acute finding. Visualized aortic arch of normal caliber with normal 3 vessel morphology. No hemodynamically significant stenosis seen about the origin of the great vessels. Subclavian arteries widely patent. CTA NECK FINDINGS Aortic arch: Visualized aortic arch of normal caliber with normal 3 vessel morphology. No flow-limiting stenosis about the origin of the great vessels. Visualized subclavian arteries widely patent. Right carotid system: Right common and internal carotid arteries widely patent without stenosis, dissection, or occlusion. Left carotid system: Left common and internal carotid arteries widely patent without stenosis, dissection, or occlusion. Vertebral arteries: Both vertebral arteries arise from the subclavian arteries. Vertebral arteries patent within the neck without occlusion or other abnormality. Skeleton: No acute osseous finding. No discrete or worrisome osseous lesions. Other neck: No other acute soft tissue abnormality within the neck. Postsurgical changes present within the right neck. No mass lesion or adenopathy. Upper chest: Visualized upper chest demonstrates no acute finding. Review of the MIP images confirms the above findings CTA HEAD FINDINGS Anterior circulation: Both internal carotid arteries widely patent to the termini without stenosis. A1 segments widely patent. Normal anterior communicating artery. Anterior cerebral arteries widely patent to their distal aspects. No M1 stenosis or occlusion. Negative MCA bifurcations. Distal MCA branches well perfused and symmetric. Posterior circulation: Both vertebral arteries patent to the vertebrobasilar junction without stenosis. Left vertebral artery dominant. Both picas patent. Basilar patent to its distal  aspect without stenosis. Superior cerebellar and posterior cerebral arteries widely patent bilaterally. Small bilateral posterior communicating arteries noted. Venous sinuses: Patent. Anatomic variants: None significant.  No intracranial aneurysm. Review of the MIP images confirms the above findings IMPRESSION: CT HEAD IMPRESSION: 1. Stable head CT.  No acute intracranial abnormality identified. 2. Chronic left ACA territory infarct. 3. Underlying atrophy with chronic small vessel ischemic disease. CTA HEAD AND NECK IMPRESSION: Negative CTA of the head and neck. No large vessel occlusion, hemodynamically significant stenosis, or other acute vascular abnormality. Electronically Signed   By: Jeannine Boga M.D.   On: 06/25/2019 02:57   DG Chest 1 View  Result Date: 06/24/2019 CLINICAL DATA:  59 year old male with dizziness, multiple falls, syncope. EXAM: CHEST  1 VIEW COMPARISON:  Portable chest 09/19/2018. FINDINGS: Portable AP semi upright view at 0719 hours. Stable lung volumes. Mediastinal contours remain normal. Visualized tracheal air column is within normal limits. Allowing for portable technique the lungs are clear. No pneumothorax or pleural effusion. Negative visible bowel gas. No acute osseous abnormality identified. IMPRESSION: Negative portable chest. Electronically Signed   By: Genevie Ann M.D.   On: 06/24/2019 07:58   CT HEAD WO CONTRAST  Result Date: 06/24/2019 CLINICAL DATA:  Weakness, dizziness EXAM: CT HEAD WITHOUT CONTRAST TECHNIQUE: Contiguous axial images were obtained from the base of the skull through the vertex without intravenous contrast. COMPARISON:  03/04/2014 FINDINGS: Brain: No evidence of acute infarction, hemorrhage, extra-axial collection, ventriculomegaly, or mass effect. Left frontal encephalomalacia. Generalized  cerebral atrophy. Periventricular white matter low attenuation likely secondary to microangiopathy. Vascular: Cerebrovascular atherosclerotic calcifications are  noted. Skull: Negative for fracture or focal lesion. Sinuses/Orbits: Visualized portions of the orbits are unremarkable. Visualized portions of the paranasal sinuses are unremarkable. Visualized portions of the mastoid air cells are unremarkable. Other: None. IMPRESSION: 1. No acute intracranial pathology. 2. Chronic microvascular disease and cerebral atrophy. Electronically Signed   By: Kathreen Devoid   On: 06/24/2019 13:35   CT ANGIO NECK W OR WO CONTRAST  Result Date: 06/25/2019 CLINICAL DATA:  Initial evaluation for acute right-sided numbness. EXAM: CT ANGIOGRAPHY HEAD AND NECK TECHNIQUE: Multidetector CT imaging of the head and neck was performed using the standard protocol during bolus administration of intravenous contrast. Multiplanar CT image reconstructions and MIPs were obtained to evaluate the vascular anatomy. Carotid stenosis measurements (when applicable) are obtained utilizing NASCET criteria, using the distal internal carotid diameter as the denominator. CONTRAST:  6mL OMNIPAQUE IOHEXOL 350 MG/ML SOLN COMPARISON:  Prior head CT from 06/24/2019 FINDINGS: CT HEAD FINDINGS Brain: Atrophy with chronic small vessel ischemic disease, stable. Encephalomalacia at the parasagittal anterior left frontal lobe noted, consistent with a chronic left ACA territory infarct. No acute intracranial hemorrhage. No acute large vessel territory infarct. No mass lesion, midline shift or mass effect. No hydrocephalus or extra-axial fluid collection. Vascular: No hyperdense vessel. Skull: Scalp soft tissues and calvarium demonstrate no acute finding. Sinuses: Paranasal sinuses mastoid air cells are clear. Orbits: Globes and orbital soft tissues demonstrate no acute finding. Visualized aortic arch of normal caliber with normal 3 vessel morphology. No hemodynamically significant stenosis seen about the origin of the great vessels. Subclavian arteries widely patent. CTA NECK FINDINGS Aortic arch: Visualized aortic arch of  normal caliber with normal 3 vessel morphology. No flow-limiting stenosis about the origin of the great vessels. Visualized subclavian arteries widely patent. Right carotid system: Right common and internal carotid arteries widely patent without stenosis, dissection, or occlusion. Left carotid system: Left common and internal carotid arteries widely patent without stenosis, dissection, or occlusion. Vertebral arteries: Both vertebral arteries arise from the subclavian arteries. Vertebral arteries patent within the neck without occlusion or other abnormality. Skeleton: No acute osseous finding. No discrete or worrisome osseous lesions. Other neck: No other acute soft tissue abnormality within the neck. Postsurgical changes present within the right neck. No mass lesion or adenopathy. Upper chest: Visualized upper chest demonstrates no acute finding. Review of the MIP images confirms the above findings CTA HEAD FINDINGS Anterior circulation: Both internal carotid arteries widely patent to the termini without stenosis. A1 segments widely patent. Normal anterior communicating artery. Anterior cerebral arteries widely patent to their distal aspects. No M1 stenosis or occlusion. Negative MCA bifurcations. Distal MCA branches well perfused and symmetric. Posterior circulation: Both vertebral arteries patent to the vertebrobasilar junction without stenosis. Left vertebral artery dominant. Both picas patent. Basilar patent to its distal aspect without stenosis. Superior cerebellar and posterior cerebral arteries widely patent bilaterally. Small bilateral posterior communicating arteries noted. Venous sinuses: Patent. Anatomic variants: None significant.  No intracranial aneurysm. Review of the MIP images confirms the above findings IMPRESSION: CT HEAD IMPRESSION: 1. Stable head CT.  No acute intracranial abnormality identified. 2. Chronic left ACA territory infarct. 3. Underlying atrophy with chronic small vessel ischemic  disease. CTA HEAD AND NECK IMPRESSION: Negative CTA of the head and neck. No large vessel occlusion, hemodynamically significant stenosis, or other acute vascular abnormality. Electronically Signed   By: Jeannine Boga M.D.   On:  06/25/2019 02:57   CT ANGIO CHEST PE W OR WO CONTRAST  Result Date: 06/24/2019 CLINICAL DATA:  Dizziness and weakness. Multiple falls. Denies chest pain or shortness of breath. EXAM: CT ANGIOGRAPHY CHEST WITH CONTRAST TECHNIQUE: Multidetector CT imaging of the chest was performed using the standard protocol during bolus administration of intravenous contrast. Multiplanar CT image reconstructions and MIPs were obtained to evaluate the vascular anatomy. CONTRAST:  53mL OMNIPAQUE IOHEXOL 350 MG/ML SOLN COMPARISON:  Chest x-ray from same day. PET-CT dated May 15, 2019. FINDINGS: Cardiovascular: Satisfactory opacification of the pulmonary arteries to the segmental level. No evidence of pulmonary embolism. Normal heart size. No pericardial effusion. No thoracic aortic aneurysm or dissection. Mediastinum/Nodes: No enlarged mediastinal, hilar, or axillary lymph nodes. Thyroid gland, trachea, and esophagus demonstrate no significant findings. Lungs/Pleura: No focal consolidation, pleural effusion, or pneumothorax. No suspicious pulmonary nodule. A few small scattered simple thin-walled cysts in both lungs again noted. Upper Abdomen: No acute abnormality. Unchanged hepatic steatosis. Musculoskeletal: No chest wall abnormality. No acute or significant osseous findings. Review of the MIP images confirms the above findings. IMPRESSION: 1. No evidence of pulmonary embolism. No acute intrathoracic process. 2. Unchanged hepatic steatosis. Electronically Signed   By: Titus Dubin M.D.   On: 06/24/2019 13:35   CT ABDOMEN PELVIS W CONTRAST  Result Date: 06/25/2019 CLINICAL DATA:  Nausea and vomiting. EXAM: CT ABDOMEN AND PELVIS WITH CONTRAST TECHNIQUE: Multidetector CT imaging of the  abdomen and pelvis was performed using the standard protocol following bolus administration of intravenous contrast. CONTRAST:  182mL OMNIPAQUE IOHEXOL 300 MG/ML  SOLN COMPARISON:  None. FINDINGS: Lower chest: Mild emphysematous changes. Left basilar scarring changes. No infiltrates or effusions. The heart is normal in size. No pericardial effusion. Hepatobiliary: No focal hepatic lesions or intrahepatic biliary dilatation. The gallbladder is normal. No common bile duct dilatation. Pancreas: No mass, inflammation or ductal dilatation. Spleen: Normal size. No focal lesions. Adrenals/Urinary Tract: The adrenal glands are normal. No worrisome renal lesions or hydronephrosis. The bladder is unremarkable. Stomach/Bowel: The stomach is unremarkable. It is moderately distended with fluid. The duodenum also is slightly distended with fluid. Proximal to mid jejunal loops of small bowel are dilated up to 4 cm with air-fluid levels. Transition to more normal sized distal jejunal and ileal loops of small bowel and normal distal ileum and terminal ileum. Findings suggest an early or partial small bowel obstruction. This is possibly due to adhesions. I do not see any evidence of a small bowel mass or intussusception. The colon is unremarkable. No acute inflammatory changes, mass lesions or obstructive findings. Moderate diffuse diverticulosis without findings for acute diverticulitis. Vascular/Lymphatic: The aorta is normal in caliber. No dissection. The branch vessels are patent. The major venous structures are patent. No mesenteric or retroperitoneal mass or adenopathy. Small scattered lymph nodes are noted. Reproductive: The prostate gland and seminal vesicles are unremarkable. Other: No pelvic mass or adenopathy. No free pelvic fluid collections. No inguinal mass or adenopathy. No abdominal wall hernia or subcutaneous lesions. Small bilateral inguinal hernias containing fat. Musculoskeletal: No significant bony findings.  IMPRESSION: 1. CT findings consistent with an early or partial small bowel obstruction without obvious cause. Possible adhesions. 2. No other significant abdominal/pelvic findings, mass lesions or adenopathy. 3. Moderate diffuse colonic diverticulosis without findings for acute diverticulitis. Electronically Signed   By: Marijo Sanes M.D.   On: 06/25/2019 12:25     Medical Consultants:    None.  Anti-Infectives:   none  Subjective:  Ronald Trujillo he relates he is had 1 bowel movement while in house no further nausea vomiting. Objective:    Vitals:   06/25/19 1113 06/25/19 1605 06/25/19 1920 06/26/19 0321  BP: (!) 133/100 134/90 (!) 146/89 (!) 146/90  Pulse: (!) 114 97 91 97  Resp: 16 18 19 14   Temp:  98.4 F (36.9 C) 98.5 F (36.9 C) 98.3 F (36.8 C)  TempSrc:  Oral Oral Oral  SpO2: 98% 98% 97% 95%  Weight:    81.2 kg  Height:       SpO2: 95 %   Intake/Output Summary (Last 24 hours) at 06/26/2019 0705 Last data filed at 06/26/2019 0043 Gross per 24 hour  Intake 480 ml  Output 1400 ml  Net -920 ml   Filed Weights   06/24/19 2140 06/25/19 0314 06/26/19 0321  Weight: 81.8 kg 83.6 kg 81.2 kg    Exam: General exam: In no acute distress. Respiratory system: Good air movement and clear to auscultation. Cardiovascular system: S1 & S2 heard, RRR. No JVD.  Gastrointestinal system: Abdomen is nondistended, soft and nontender.  Central nervous system: Alert and oriented. No focal neurological deficits. Extremities: No lower extremity edema. Psychiatry: Judgement and insight appear normal. Mood & affect appropriate.   Data Reviewed:    Labs: Basic Metabolic Panel: Recent Labs  Lab 06/24/19 0657 06/24/19 0657 06/25/19 0516 06/25/19 0516 06/25/19 0829 06/26/19 0407  NA 138  --  143  --  144 143  K 4.0   < > 3.3*   < > 3.6 3.7  CL 103  --  109  --  109 111  CO2 24  --  24  --  23 24  GLUCOSE 143*  --  134*  --  156* 136*  BUN 27*  --  23*  --  20 12   CREATININE 0.97  --  0.77  --  0.87 0.72  CALCIUM 9.2  --  8.7*  --  8.8* 8.4*   < > = values in this interval not displayed.   GFR Estimated Creatinine Clearance: 98.2 mL/min (by C-G formula based on SCr of 0.72 mg/dL). Liver Function Tests: Recent Labs  Lab 06/24/19 0657 06/26/19 0407  AST 18 19  ALT 30 25  ALKPHOS 56 46  BILITOT 0.6 1.3*  PROT 7.7 6.2*  ALBUMIN 4.1 3.5   No results for input(s): LIPASE, AMYLASE in the last 168 hours. No results for input(s): AMMONIA in the last 168 hours. Coagulation profile No results for input(s): INR, PROTIME in the last 168 hours. COVID-19 Labs  No results for input(s): DDIMER, FERRITIN, LDH, CRP in the last 72 hours.  Lab Results  Component Value Date   [place] NEGATIVE 06/24/2019    CBC: Recent Labs  Lab 06/24/19 0657 06/24/19 2146 06/25/19 0829 06/26/19 0407  WBC 18.3* 14.3* 16.9* 11.7*  NEUTROABS 13.9*  --   --   --   HGB 12.4* 10.8* 11.9* 9.8*  HCT 38.0* 32.8* 35.4* 30.7*  MCV 86.6 85.4 82.9 86.7  PLT 408* 339 391 301   Cardiac Enzymes: No results for input(s): CKTOTAL, CKMB, CKMBINDEX, TROPONINI in the last 168 hours. BNP (last 3 results) No results for input(s): PROBNP in the last 8760 hours. CBG: Recent Labs  Lab 06/25/19 0039 06/25/19 0738 06/25/19 1113 06/25/19 1622 06/25/19 2147  GLUCAP 114* 126* 126* 110* 110*   D-Dimer: No results for input(s): DDIMER in the last 72 hours. Hgb A1c: Recent Labs    06/24/19  2146  HGBA1C 6.3*   Lipid Profile: No results for input(s): CHOL, HDL, LDLCALC, TRIG, CHOLHDL, LDLDIRECT in the last 72 hours. Thyroid function studies: Recent Labs    06/25/19 0516  TSH 0.515   Anemia work up: No results for input(s): VITAMINB12, FOLATE, FERRITIN, TIBC, IRON, RETICCTPCT in the last 72 hours. Sepsis Labs: Recent Labs  Lab 06/24/19 0657 06/24/19 0813 06/24/19 2146 06/25/19 0516 06/25/19 0829 06/26/19 0407  PROCALCITON  --   --   --  <0.10  --  <0.10  WBC  18.3*  --  14.3*  --  16.9* 11.7*  LATICACIDVEN  --  2.2*  2.0*  --   --   --   --    Microbiology Recent Results (from the past 240 hour(s))  Blood culture (routine x 2)     Status: None (Preliminary result)   Collection Time: 06/24/19  8:13 AM   Specimen: BLOOD RIGHT ARM  Result Value Ref Range Status   Specimen Description BLOOD RIGHT ARM  Final   Special Requests   Final    BOTTLES DRAWN AEROBIC AND ANAEROBIC Blood Culture adequate volume   Culture   Final    NO GROWTH 2 DAYS Performed at [facility], [address] [place], [place] [address]    Report Status PENDING  Incomplete  Blood culture (routine x 2)     Status: None (Preliminary result)   Collection Time: 06/24/19  8:14 AM   Specimen: BLOOD  Result Value Ref Range Status   Specimen Description BLOOD BLOOD LEFT FOREARM  Final   Special Requests   Final    BOTTLES DRAWN AEROBIC AND ANAEROBIC Blood Culture adequate volume   Culture   Final    NO GROWTH 2 DAYS Performed at [facility], [address] [place], [place] [address]    Report Status PENDING  Incomplete  Respiratory Panel by RT PCR (Flu A&B, Covid) -     Status: None   Collection Time: 06/24/19 11:41 AM  Result Value Ref Range Status   SARS Coronavirus 2 by RT PCR NEGATIVE NEGATIVE Final    Comment: (NOTE) SARS-CoV-2 target nucleic acids are NOT DETECTED. The SARS-CoV-2 RNA is generally detectable in upper respiratoy specimens during the acute phase of infection. The lowest concentration of SARS-CoV-2 viral copies this assay can detect is 131 copies/mL. A negative result does not preclude SARS-Cov-2 infection and should not be used as the sole basis for treatment or other patient management decisions. A negative result may occur with  improper specimen collection/handling, submission of specimen other than nasopharyngeal swab, presence of viral mutation(s) within the areas targeted by this assay, and inadequate number of viral  copies (<131 copies/mL). A negative result must be combined with clinical observations, patient history, and epidemiological information. The expected result is Negative. Fact Sheet for Patients:  PinkCheek.be Fact Sheet for Healthcare Providers:  GravelBags.it This test is not yet ap proved or cleared by the [place] FDA and  has been authorized for detection and/or diagnosis of SARS-CoV-2 by FDA under an Emergency Use Authorization (EUA). This EUA will remain  in effect (meaning this test can be used) for the duration of the COVID-19 declaration under Section 564(b)(1) of the Act, 21 U.S.C. section 360bbb-3(b)(1), unless the authorization is terminated or revoked sooner.    Influenza A by PCR NEGATIVE NEGATIVE Final   Influenza B by PCR NEGATIVE NEGATIVE Final    Comment: (NOTE) The Xpert Xpress SARS-CoV-2/FLU/RSV assay is intended as  an aid in  the diagnosis of influenza from Nasopharyngeal swab specimens and  should not be used as a sole basis for treatment. Nasal washings and  aspirates are unacceptable for Xpert Xpress SARS-CoV-2/FLU/RSV  testing. Fact Sheet for Patients: PinkCheek.be Fact Sheet for Healthcare Providers: GravelBags.it This test is not yet approved or cleared by the [place] FDA and  has been authorized for detection and/or diagnosis of SARS-CoV-2 by  FDA under an Emergency Use Authorization (EUA). This EUA will remain  in effect (meaning this test can be used) for the duration of the  Covid-19 declaration under Section 564(b)(1) of the Act, 21  U.S.C. section 360bbb-3(b)(1), unless the authorization is  terminated or revoked. Performed at [facility], [address], [place], [place] [address]      Medications:   . cholecalciferol  1,000 Units Oral Q1500  . insulin aspart  0-15 Units Subcutaneous TID WC  . meclizine   25 mg Oral TID  . pravastatin  20 mg Oral q1800  . cyanocobalamin  1,000 mcg Oral Q1500   Continuous Infusions: . sodium chloride 150 mL/hr at 06/26/19 0421      LOS: 1 day   Charlynne Cousins  [facility]  06/26/2019, 7:05 AM

## 2019-06-27 LAB — GLUCOSE, CAPILLARY
Glucose-Capillary: 97 mg/dL (ref 70–99)
Glucose-Capillary: 109 mg/dL — ABNORMAL HIGH (ref 70–99)

## 2019-06-27 LAB — PROCALCITONIN: Procalcitonin: <0.1 ng/mL

## 2019-06-27 NOTE — Discharge Summary (Signed)
Physician Discharge Summary  Ronald Trujillo [id number] DOB: [birth year] DOA: 06/24/2019  PCP: Adin Hector, MD  Admit date: 06/24/2019 Discharge date: 06/27/2019  Admitted From: Home Disposition:  Home  Recommendations for Outpatient Follow-up:  1. Follow up with PCP in 1-2 weeks 2. Please obtain BMP/CBC in one week   Home Health:No Equipment/Devices:None  Discharge Condition:Stable CODE STATUS:Full Diet recommendation: Heart Healthy   Brief/Interim Summary:  59 y.o. male past medical history significant of anemia arthritis hypertension was brought into the emergency room by EMS for evaluation of multiple episodes of fall at home and dizziness.  Patient does not recall if she has lost consciousness during these episodes.  She status post a tonsillectomy and have been having hematemesis.  EMS noticed tachycardia upon standing and a drop in systolic blood pressure to 90 in the ER.  With a white blood cell count of 18,000 lactic acid of 2  Discharge Diagnoses:  Active Problems:   Near syncope   Lactic acidosis   Type 2 diabetes mellitus without complication (HCC) Near syncope: Patient was hypotensive on arrival in the ED with a blood pressure in the 90s antihypertensive medications were held orthostatics were positive a 2D echo was done that showed a preserved EF with no diastolic heart failure valves were unremarkable and he had no aortic stenosis, he was monitored on telemetry with no events cardiac biomarkers remain flat. He had a drop in hemoglobin from 13 last month to 9.8 and he was on Plavix with a recent tonsillectomy and positive FOBT GI was consulted recommended further work-up as an outpatient. He was able to tolerate his diet and was discharged in stable condition. His Elavil will stop as it has a high likelihood of causing or worsening orthostatic hypotension.  He uses this for sleep as per chart but the patient did not know why he was taking it for so we will  discontinue Elavil.  Metabolic acidosis/lactic acidosis: Likely due to hypoperfusion procalcitonin was reassured he resolved with IV fluid hydration.  Diabetes mellitus type 2 No changes made to his medication.  Incidental partial small bowel obstruction: Patient was vomiting on 06/25/2018 when general surgery was consulted CT scan was done that showed a partial small bowel obstruction patient nausea resolved he was passing gas able to tolerate his diet so surgery recommended no further intervention.  The day prior to discharge he had a bowel movement and he was tolerating his diet on the day of discharge.  Discharge Instructions  Discharge Instructions    Diet - low sodium heart healthy   Complete by: As directed    Increase activity slowly   Complete by: As directed      Allergies as of 06/27/2019      Reactions   Aggrenox [aspirin-dipyridamole Er] Nausea And Vomiting   Cialis [tadalafil] Other (See Comments)   Precipitated TIA that caused vision problems   Codeine Other (See Comments)   migraines   Morphine And Related Nausea And Vomiting   Oxycodone Other (See Comments)   migraines      Medication List    STOP taking these medications   amitriptyline 25 MG tablet Commonly known as: ELAVIL     TAKE these medications   acetaminophen 325 MG tablet Commonly known as: TYLENOL Take 650 mg by mouth every 4 (four) hours as needed.   chlorhexidine 0.12 % solution Commonly known as: PERIDEX 15 mLs by Mouth Rinse route 3 (three) times daily.   Cholecalciferol 25 MCG (1000  UT) tablet Take 1 tablet by mouth daily in the afternoon.   clopidogrel 75 MG tablet Commonly known as: PLAVIX Take 75 mg by mouth daily. PM   cyanocobalamin 1000 MCG tablet Take 1 tablet by mouth daily in the afternoon.   Emgality 120 MG/ML Soaj Generic drug: Galcanezumab-gnlm Inject 1 mL into the skin every 30 (thirty) days.   lisinopril 20 MG tablet Commonly known as: ZESTRIL Take 20 mg by  mouth daily.   lovastatin 20 MG tablet Commonly known as: MEVACOR Take 20 mg by mouth daily. 5pm   metFORMIN 500 MG tablet Commonly known as: GLUCOPHAGE Take 500 mg by mouth 2 (two) times daily.   methylPREDNISolone 4 MG Tbpk tablet Commonly known as: MEDROL DOSEPAK Take 4 mg by mouth taper from 4 doses each day to 1 dose and stop.   prochlorperazine 10 MG tablet Commonly known as: COMPAZINE Take 1 tablet by mouth every 6 (six) hours as needed.   propranolol 20 MG tablet Commonly known as: INDERAL Take 20 mg by mouth daily.   tiZANidine 2 MG tablet Commonly known as: ZANAFLEX Take 1 tablet by mouth every 6 (six) hours as needed.       Allergies  Allergen Reactions  . Aggrenox [Aspirin-Dipyridamole Er] Nausea And Vomiting  . Cialis [Tadalafil] Other (See Comments)    Precipitated TIA that caused vision problems  . Codeine Other (See Comments)    migraines  . Morphine And Related Nausea And Vomiting  . Oxycodone Other (See Comments)    migraines    Consultations:  General surgery  Gastroenterology   Procedures/Studies: CT ANGIO HEAD W OR WO CONTRAST  Result Date: 06/25/2019 CLINICAL DATA:  Initial evaluation for acute right-sided numbness. EXAM: CT ANGIOGRAPHY HEAD AND NECK TECHNIQUE: Multidetector CT imaging of the head and neck was performed using the standard protocol during bolus administration of intravenous contrast. Multiplanar CT image reconstructions and MIPs were obtained to evaluate the vascular anatomy. Carotid stenosis measurements (when applicable) are obtained utilizing NASCET criteria, using the distal internal carotid diameter as the denominator. CONTRAST:  68mL OMNIPAQUE IOHEXOL 350 MG/ML SOLN COMPARISON:  Prior head CT from 06/24/2019 FINDINGS: CT HEAD FINDINGS Brain: Atrophy with chronic small vessel ischemic disease, stable. Encephalomalacia at the parasagittal anterior left frontal lobe noted, consistent with a chronic left ACA territory infarct.  No acute intracranial hemorrhage. No acute large vessel territory infarct. No mass lesion, midline shift or mass effect. No hydrocephalus or extra-axial fluid collection. Vascular: No hyperdense vessel. Skull: Scalp soft tissues and calvarium demonstrate no acute finding. Sinuses: Paranasal sinuses mastoid air cells are clear. Orbits: Globes and orbital soft tissues demonstrate no acute finding. Visualized aortic arch of normal caliber with normal 3 vessel morphology. No hemodynamically significant stenosis seen about the origin of the great vessels. Subclavian arteries widely patent. CTA NECK FINDINGS Aortic arch: Visualized aortic arch of normal caliber with normal 3 vessel morphology. No flow-limiting stenosis about the origin of the great vessels. Visualized subclavian arteries widely patent. Right carotid system: Right common and internal carotid arteries widely patent without stenosis, dissection, or occlusion. Left carotid system: Left common and internal carotid arteries widely patent without stenosis, dissection, or occlusion. Vertebral arteries: Both vertebral arteries arise from the subclavian arteries. Vertebral arteries patent within the neck without occlusion or other abnormality. Skeleton: No acute osseous finding. No discrete or worrisome osseous lesions. Other neck: No other acute soft tissue abnormality within the neck. Postsurgical changes present within the right neck. No mass lesion or  adenopathy. Upper chest: Visualized upper chest demonstrates no acute finding. Review of the MIP images confirms the above findings CTA HEAD FINDINGS Anterior circulation: Both internal carotid arteries widely patent to the termini without stenosis. A1 segments widely patent. Normal anterior communicating artery. Anterior cerebral arteries widely patent to their distal aspects. No M1 stenosis or occlusion. Negative MCA bifurcations. Distal MCA branches well perfused and symmetric. Posterior circulation: Both  vertebral arteries patent to the vertebrobasilar junction without stenosis. Left vertebral artery dominant. Both picas patent. Basilar patent to its distal aspect without stenosis. Superior cerebellar and posterior cerebral arteries widely patent bilaterally. Small bilateral posterior communicating arteries noted. Venous sinuses: Patent. Anatomic variants: None significant.  No intracranial aneurysm. Review of the MIP images confirms the above findings IMPRESSION: CT HEAD IMPRESSION: 1. Stable head CT.  No acute intracranial abnormality identified. 2. Chronic left ACA territory infarct. 3. Underlying atrophy with chronic small vessel ischemic disease. CTA HEAD AND NECK IMPRESSION: Negative CTA of the head and neck. No large vessel occlusion, hemodynamically significant stenosis, or other acute vascular abnormality. Electronically Signed   By: Jeannine Boga M.D.   On: 06/25/2019 02:57   DG Chest 1 View  Result Date: 06/24/2019 CLINICAL DATA:  59 year old male with dizziness, multiple falls, syncope. EXAM: CHEST  1 VIEW COMPARISON:  Portable chest 09/19/2018. FINDINGS: Portable AP semi upright view at 0719 hours. Stable lung volumes. Mediastinal contours remain normal. Visualized tracheal air column is within normal limits. Allowing for portable technique the lungs are clear. No pneumothorax or pleural effusion. Negative visible bowel gas. No acute osseous abnormality identified. IMPRESSION: Negative portable chest. Electronically Signed   By: Genevie Ann M.D.   On: 06/24/2019 07:58   CT HEAD WO CONTRAST  Result Date: 06/24/2019 CLINICAL DATA:  Weakness, dizziness EXAM: CT HEAD WITHOUT CONTRAST TECHNIQUE: Contiguous axial images were obtained from the base of the skull through the vertex without intravenous contrast. COMPARISON:  03/04/2014 FINDINGS: Brain: No evidence of acute infarction, hemorrhage, extra-axial collection, ventriculomegaly, or mass effect. Left frontal encephalomalacia. Generalized  cerebral atrophy. Periventricular white matter low attenuation likely secondary to microangiopathy. Vascular: Cerebrovascular atherosclerotic calcifications are noted. Skull: Negative for fracture or focal lesion. Sinuses/Orbits: Visualized portions of the orbits are unremarkable. Visualized portions of the paranasal sinuses are unremarkable. Visualized portions of the mastoid air cells are unremarkable. Other: None. IMPRESSION: 1. No acute intracranial pathology. 2. Chronic microvascular disease and cerebral atrophy. Electronically Signed   By: Kathreen Devoid   On: 06/24/2019 13:35   CT ANGIO NECK W OR WO CONTRAST  Result Date: 06/25/2019 CLINICAL DATA:  Initial evaluation for acute right-sided numbness. EXAM: CT ANGIOGRAPHY HEAD AND NECK TECHNIQUE: Multidetector CT imaging of the head and neck was performed using the standard protocol during bolus administration of intravenous contrast. Multiplanar CT image reconstructions and MIPs were obtained to evaluate the vascular anatomy. Carotid stenosis measurements (when applicable) are obtained utilizing NASCET criteria, using the distal internal carotid diameter as the denominator. CONTRAST:  53mL OMNIPAQUE IOHEXOL 350 MG/ML SOLN COMPARISON:  Prior head CT from 06/24/2019 FINDINGS: CT HEAD FINDINGS Brain: Atrophy with chronic small vessel ischemic disease, stable. Encephalomalacia at the parasagittal anterior left frontal lobe noted, consistent with a chronic left ACA territory infarct. No acute intracranial hemorrhage. No acute large vessel territory infarct. No mass lesion, midline shift or mass effect. No hydrocephalus or extra-axial fluid collection. Vascular: No hyperdense vessel. Skull: Scalp soft tissues and calvarium demonstrate no acute finding. Sinuses: Paranasal sinuses mastoid air cells are clear.  Orbits: Globes and orbital soft tissues demonstrate no acute finding. Visualized aortic arch of normal caliber with normal 3 vessel morphology. No  hemodynamically significant stenosis seen about the origin of the great vessels. Subclavian arteries widely patent. CTA NECK FINDINGS Aortic arch: Visualized aortic arch of normal caliber with normal 3 vessel morphology. No flow-limiting stenosis about the origin of the great vessels. Visualized subclavian arteries widely patent. Right carotid system: Right common and internal carotid arteries widely patent without stenosis, dissection, or occlusion. Left carotid system: Left common and internal carotid arteries widely patent without stenosis, dissection, or occlusion. Vertebral arteries: Both vertebral arteries arise from the subclavian arteries. Vertebral arteries patent within the neck without occlusion or other abnormality. Skeleton: No acute osseous finding. No discrete or worrisome osseous lesions. Other neck: No other acute soft tissue abnormality within the neck. Postsurgical changes present within the right neck. No mass lesion or adenopathy. Upper chest: Visualized upper chest demonstrates no acute finding. Review of the MIP images confirms the above findings CTA HEAD FINDINGS Anterior circulation: Both internal carotid arteries widely patent to the termini without stenosis. A1 segments widely patent. Normal anterior communicating artery. Anterior cerebral arteries widely patent to their distal aspects. No M1 stenosis or occlusion. Negative MCA bifurcations. Distal MCA branches well perfused and symmetric. Posterior circulation: Both vertebral arteries patent to the vertebrobasilar junction without stenosis. Left vertebral artery dominant. Both picas patent. Basilar patent to its distal aspect without stenosis. Superior cerebellar and posterior cerebral arteries widely patent bilaterally. Small bilateral posterior communicating arteries noted. Venous sinuses: Patent. Anatomic variants: None significant.  No intracranial aneurysm. Review of the MIP images confirms the above findings IMPRESSION: CT HEAD  IMPRESSION: 1. Stable head CT.  No acute intracranial abnormality identified. 2. Chronic left ACA territory infarct. 3. Underlying atrophy with chronic small vessel ischemic disease. CTA HEAD AND NECK IMPRESSION: Negative CTA of the head and neck. No large vessel occlusion, hemodynamically significant stenosis, or other acute vascular abnormality. Electronically Signed   By: Jeannine Boga M.D.   On: 06/25/2019 02:57   CT ANGIO CHEST PE W OR WO CONTRAST  Result Date: 06/24/2019 CLINICAL DATA:  Dizziness and weakness. Multiple falls. Denies chest pain or shortness of breath. EXAM: CT ANGIOGRAPHY CHEST WITH CONTRAST TECHNIQUE: Multidetector CT imaging of the chest was performed using the standard protocol during bolus administration of intravenous contrast. Multiplanar CT image reconstructions and MIPs were obtained to evaluate the vascular anatomy. CONTRAST:  83mL OMNIPAQUE IOHEXOL 350 MG/ML SOLN COMPARISON:  Chest x-ray from same day. PET-CT dated May 15, 2019. FINDINGS: Cardiovascular: Satisfactory opacification of the pulmonary arteries to the segmental level. No evidence of pulmonary embolism. Normal heart size. No pericardial effusion. No thoracic aortic aneurysm or dissection. Mediastinum/Nodes: No enlarged mediastinal, hilar, or axillary lymph nodes. Thyroid gland, trachea, and esophagus demonstrate no significant findings. Lungs/Pleura: No focal consolidation, pleural effusion, or pneumothorax. No suspicious pulmonary nodule. A few small scattered simple thin-walled cysts in both lungs again noted. Upper Abdomen: No acute abnormality. Unchanged hepatic steatosis. Musculoskeletal: No chest wall abnormality. No acute or significant osseous findings. Review of the MIP images confirms the above findings. IMPRESSION: 1. No evidence of pulmonary embolism. No acute intrathoracic process. 2. Unchanged hepatic steatosis. Electronically Signed   By: Titus Dubin M.D.   On: 06/24/2019 13:35   MR LUMBAR  SPINE WO CONTRAST  Result Date: 06/23/2019 CLINICAL DATA:  Low back and right leg pain for a few months. EXAM: MRI LUMBAR SPINE WITHOUT CONTRAST TECHNIQUE: Multiplanar,  multisequence MR imaging of the lumbar spine was performed. No intravenous contrast was administered. COMPARISON:  None. FINDINGS: Segmentation:  Standard. Alignment:  3 mm retrolisthesis of L4 on L5. Vertebrae: No fracture, suspicious osseous lesion, or significant marrow edema. Conus medullaris and cauda equina: Conus extends to the L1 level. Conus and cauda equina appear normal. Paraspinal and other soft tissues: Unremarkable. Disc levels: Disc desiccation throughout the lumbar spine with preserved disc space heights. T11-12 and T12-L1: Mild spondylosis without disc herniation or stenosis. L1-2: Negative. L2-3: Mild facet hypertrophy without disc herniation or stenosis. L3-4: Mild disc bulging and moderate facet and ligamentum flavum hypertrophy result in borderline left lateral recess stenosis and mild right and borderline left neural foraminal stenosis without spinal stenosis. L4-5: Retrolisthesis with bulging uncovered disc, a central pseudo disc extrusion with mild inferior migration, and moderate facet and ligamentum flavum hypertrophy result in moderate spinal stenosis, right greater than left lateral recess stenosis, and moderate right and mild left neural foraminal stenosis. L5 nerve root impingement is possible bilaterally, particularly on the right. L5-S1: Tiny central disc protrusion and mild to moderate right and mild left facet hypertrophy without stenosis. IMPRESSION: Lumbar spondylosis and facet degeneration, most notable at L4-5 where there is moderate spinal stenosis and right greater than left lateral recess and neural foraminal stenosis. Electronically Signed   By: Logan Bores M.D.   On: 06/23/2019 10:37   CT ABDOMEN PELVIS W CONTRAST  Result Date: 06/25/2019 CLINICAL DATA:  Nausea and vomiting. EXAM: CT ABDOMEN AND  PELVIS WITH CONTRAST TECHNIQUE: Multidetector CT imaging of the abdomen and pelvis was performed using the standard protocol following bolus administration of intravenous contrast. CONTRAST:  171mL OMNIPAQUE IOHEXOL 300 MG/ML  SOLN COMPARISON:  None. FINDINGS: Lower chest: Mild emphysematous changes. Left basilar scarring changes. No infiltrates or effusions. The heart is normal in size. No pericardial effusion. Hepatobiliary: No focal hepatic lesions or intrahepatic biliary dilatation. The gallbladder is normal. No common bile duct dilatation. Pancreas: No mass, inflammation or ductal dilatation. Spleen: Normal size. No focal lesions. Adrenals/Urinary Tract: The adrenal glands are normal. No worrisome renal lesions or hydronephrosis. The bladder is unremarkable. Stomach/Bowel: The stomach is unremarkable. It is moderately distended with fluid. The duodenum also is slightly distended with fluid. Proximal to mid jejunal loops of small bowel are dilated up to 4 cm with air-fluid levels. Transition to more normal sized distal jejunal and ileal loops of small bowel and normal distal ileum and terminal ileum. Findings suggest an early or partial small bowel obstruction. This is possibly due to adhesions. I do not see any evidence of a small bowel mass or intussusception. The colon is unremarkable. No acute inflammatory changes, mass lesions or obstructive findings. Moderate diffuse diverticulosis without findings for acute diverticulitis. Vascular/Lymphatic: The aorta is normal in caliber. No dissection. The branch vessels are patent. The major venous structures are patent. No mesenteric or retroperitoneal mass or adenopathy. Small scattered lymph nodes are noted. Reproductive: The prostate gland and seminal vesicles are unremarkable. Other: No pelvic mass or adenopathy. No free pelvic fluid collections. No inguinal mass or adenopathy. No abdominal wall hernia or subcutaneous lesions. Small bilateral inguinal hernias  containing fat. Musculoskeletal: No significant bony findings. IMPRESSION: 1. CT findings consistent with an early or partial small bowel obstruction without obvious cause. Possible adhesions. 2. No other significant abdominal/pelvic findings, mass lesions or adenopathy. 3. Moderate diffuse colonic diverticulosis without findings for acute diverticulitis. Electronically Signed   By: Marijo Sanes M.D.   On:  06/25/2019 12:25   ECHOCARDIOGRAM COMPLETE  Result Date: 06/26/2019    ECHOCARDIOGRAM REPORT   Patient Name:   Ronald Trujillo Date of Exam: 06/25/2019 Medical Rec #:  [id number]          Height:       69.0 in Accession #:    [id number]         Weight:       184.2 lb Date of Birth:  [birth year]          BSA:          1.995 m Patient Age:    19 years           BP:           146/89 mmHg Patient Gender: M                  HR:           96 bpm. Exam Location:  [facility] Procedure: 2D Echo, Cardiac Doppler and Color Doppler Indications:     R55 Syncope  History:         Patient has no prior history of Echocardiogram examinations.                  Risk Factors:Hypertension and Dyslipidemia. Stroke.  Sonographer:     Wilford Sports Rodgers-Jones Referring Phys:  Tigard Diagnosing Phys: Neoma Laming MD IMPRESSIONS  1. Left ventricular ejection fraction, by estimation, is 60 to 65%. The left ventricle has normal function. The left ventricle has no regional wall motion abnormalities. Left ventricular diastolic parameters are consistent with Grade I diastolic dysfunction (impaired relaxation).  2. Right ventricular systolic function is normal. The right ventricular size is normal.  3. The mitral valve is normal in structure. Trivial mitral valve regurgitation. No evidence of mitral stenosis.  4. The aortic valve is normal in structure. Aortic valve regurgitation is not visualized. No aortic stenosis is present.  5. The inferior vena cava is normal in size with greater than 50% respiratory variability, suggesting  right atrial pressure of 3 mmHg. FINDINGS  Left Ventricle: Left ventricular ejection fraction, by estimation, is 60 to 65%. The left ventricle has normal function. The left ventricle has no regional wall motion abnormalities. The left ventricular internal cavity size was normal in size. There is  no left ventricular hypertrophy. Left ventricular diastolic parameters are consistent with Grade I diastolic dysfunction (impaired relaxation). Right Ventricle: The right ventricular size is normal. No increase in right ventricular wall thickness. Right ventricular systolic function is normal. Left Atrium: Left atrial size was normal in size. Right Atrium: Right atrial size was normal in size. Pericardium: There is no evidence of pericardial effusion. Mitral Valve: The mitral valve is normal in structure. Normal mobility of the mitral valve leaflets. Trivial mitral valve regurgitation. No evidence of mitral valve stenosis. Tricuspid Valve: The tricuspid valve is normal in structure. Tricuspid valve regurgitation is trivial. No evidence of tricuspid stenosis. Aortic Valve: The aortic valve is normal in structure. Aortic valve regurgitation is not visualized. No aortic stenosis is present. Pulmonic Valve: The pulmonic valve was normal in structure. Pulmonic valve regurgitation is trivial. No evidence of pulmonic stenosis. Aorta: The aortic root is normal in size and structure. Venous: The inferior vena cava is normal in size with greater than 50% respiratory variability, suggesting right atrial pressure of 3 mmHg. IAS/Shunts: No atrial level shunt detected by color flow Doppler.  LEFT VENTRICLE PLAX 2D LVIDd:  4.00 cm  Diastology LVIDs:         2.52 cm  LV e' lateral:   10.90 cm/s LV PW:         1.03 cm  LV E/e' lateral: 7.2 LV IVS:        1.17 cm  LV e' medial:    5.55 cm/s LVOT diam:     2.10 cm  LV E/e' medial:  14.1 LVOT Area:     3.46 cm  RIGHT VENTRICLE RV Basal diam:  4.21 cm RV S prime:     16.80 cm/s TAPSE  (M-mode): 3.0 cm LEFT ATRIUM           Index       RIGHT ATRIUM           Index LA diam:      3.70 cm 1.85 cm/m  RA Area:     12.90 cm LA Vol (A2C): 31.4 ml 15.74 ml/m RA Volume:   31.50 ml  15.79 ml/m LA Vol (A4C): 52.6 ml 26.37 ml/m   AORTA Ao Root diam: 3.50 cm Ao Asc diam:  4.10 cm MITRAL VALVE MV Area (PHT): 2.32 cm    SHUNTS MV Decel Time: 327 msec    Systemic Diam: 2.10 cm MV E velocity: 78.20 cm/s MV A velocity: 97.90 cm/s MV E/A ratio:  0.80 Neoma Laming MD Electronically signed by Neoma Laming MD Signature Date/Time: 06/26/2019/9:12:12 AM    Final    Subjective: No complains  Discharge Exam: Vitals:   06/27/19 0504 06/27/19 0738  BP: 139/80 130/83  Pulse: 72 67  Resp: 20 17  Temp: 97.8 F (36.6 C) 98 F (36.7 C)  SpO2: 99% 99%   Vitals:   06/26/19 1134 06/26/19 1511 06/27/19 0504 06/27/19 0738  BP: 135/81 132/83 139/80 130/83  Pulse: 80 84 72 67  Resp: 18 18 20 17   Temp: 98.2 F (36.8 C) 98.1 F (36.7 C) 97.8 F (36.6 C) 98 F (36.7 C)  TempSrc: Oral Oral Oral   SpO2: 99% 99% 99% 99%  Weight:   83 kg   Height:        General: Pt is alert, awake, not in acute distress Cardiovascular: RRR, S1/S2 +, no rubs, no gallops Respiratory: CTA bilaterally, no wheezing, no rhonchi Abdominal: Soft, NT, ND, bowel sounds + Extremities: no edema, no cyanosis    The results of significant diagnostics from this hospitalization (including imaging, microbiology, ancillary and laboratory) are listed below for reference.     Microbiology: Recent Results (from the past 240 hour(s))  Blood culture (routine x 2)     Status: None (Preliminary result)   Collection Time: 06/24/19  8:13 AM   Specimen: BLOOD RIGHT ARM  Result Value Ref Range Status   Specimen Description BLOOD RIGHT ARM  Final   Special Requests   Final    BOTTLES DRAWN AEROBIC AND ANAEROBIC Blood Culture adequate volume   Culture   Final    NO GROWTH 3 DAYS Performed at [facility], [address] [place], [place] [address]    Report Status PENDING  Incomplete  Blood culture (routine x 2)     Status: None (Preliminary result)   Collection Time: 06/24/19  8:14 AM   Specimen: BLOOD  Result Value Ref Range Status   Specimen Description BLOOD BLOOD LEFT FOREARM  Final   Special Requests   Final    BOTTLES DRAWN AEROBIC AND ANAEROBIC Blood Culture adequate volume   Culture  Final    NO GROWTH 3 DAYS Performed at [facility], [address] [place], [place] [address]    Report Status PENDING  Incomplete  Respiratory Panel by RT PCR (Flu A&B, Covid) -     Status: None   Collection Time: 06/24/19 11:41 AM  Result Value Ref Range Status   SARS Coronavirus 2 by RT PCR NEGATIVE NEGATIVE Final    Comment: (NOTE) SARS-CoV-2 target nucleic acids are NOT DETECTED. The SARS-CoV-2 RNA is generally detectable in upper respiratoy specimens during the acute phase of infection. The lowest concentration of SARS-CoV-2 viral copies this assay can detect is 131 copies/mL. A negative result does not preclude SARS-Cov-2 infection and should not be used as the sole basis for treatment or other patient management decisions. A negative result may occur with  improper specimen collection/handling, submission of specimen other than nasopharyngeal swab, presence of viral mutation(s) within the areas targeted by this assay, and inadequate number of viral copies (<131 copies/mL). A negative result must be combined with clinical observations, patient history, and epidemiological information. The expected result is Negative. Fact Sheet for Patients:  PinkCheek.be Fact Sheet for Healthcare Providers:  GravelBags.it This test is not yet ap proved or cleared by the [place] FDA and  has been authorized for detection and/or diagnosis of SARS-CoV-2 by FDA under an Emergency Use Authorization (EUA). This EUA will remain  in effect (meaning  this test can be used) for the duration of the COVID-19 declaration under Section 564(b)(1) of the Act, 21 U.S.C. section 360bbb-3(b)(1), unless the authorization is terminated or revoked sooner.    Influenza A by PCR NEGATIVE NEGATIVE Final   Influenza B by PCR NEGATIVE NEGATIVE Final    Comment: (NOTE) The Xpert Xpress SARS-CoV-2/FLU/RSV assay is intended as an aid in  the diagnosis of influenza from Nasopharyngeal swab specimens and  should not be used as a sole basis for treatment. Nasal washings and  aspirates are unacceptable for Xpert Xpress SARS-CoV-2/FLU/RSV  testing. Fact Sheet for Patients: PinkCheek.be Fact Sheet for Healthcare Providers: GravelBags.it This test is not yet approved or cleared by the [place] FDA and  has been authorized for detection and/or diagnosis of SARS-CoV-2 by  FDA under an Emergency Use Authorization (EUA). This EUA will remain  in effect (meaning this test can be used) for the duration of the  Covid-19 declaration under Section 564(b)(1) of the Act, 21  U.S.C. section 360bbb-3(b)(1), unless the authorization is  terminated or revoked. Performed at [facility], [address] [place], [place] [address]      Labs: BNP (last 3 results) No results for input(s): BNP in the last 8760 hours. Basic Metabolic Panel: Recent Labs  Lab 06/24/19 0657 06/25/19 0516 06/25/19 0829 06/26/19 0407  NA 138 143 144 143  K 4.0 3.3* 3.6 3.7  CL 103 109 109 111  CO2 24 24 23 24   GLUCOSE 143* 134* 156* 136*  BUN 27* 23* 20 12  CREATININE 0.97 0.77 0.87 0.72  CALCIUM 9.2 8.7* 8.8* 8.4*   Liver Function Tests: Recent Labs  Lab 06/24/19 0657 06/26/19 0407  AST 18 19  ALT 30 25  ALKPHOS 56 46  BILITOT 0.6 1.3*  PROT 7.7 6.2*  ALBUMIN 4.1 3.5   No results for input(s): LIPASE, AMYLASE in the last 168 hours. No results for input(s): AMMONIA in the last 168 hours. CBC: Recent  Labs  Lab 06/24/19 0657 06/24/19 2146 06/25/19 0829 06/26/19 0407  WBC 18.3* 14.3*  16.9* 11.7*  NEUTROABS 13.9*  --   --   --   HGB 12.4* 10.8* 11.9* 9.8*  HCT 38.0* 32.8* 35.4* 30.7*  MCV 86.6 85.4 82.9 86.7  PLT 408* 339 391 301   Cardiac Enzymes: No results for input(s): CKTOTAL, CKMB, CKMBINDEX, TROPONINI in the last 168 hours. BNP: Invalid input(s): POCBNP CBG: Recent Labs  Lab 06/26/19 0728 06/26/19 1135 06/26/19 1629 06/26/19 2120 06/27/19 0739  GLUCAP 107* 105* 88 106* 97   D-Dimer No results for input(s): DDIMER in the last 72 hours. Hgb A1c Recent Labs    06/24/19 2146  HGBA1C 6.3*   Lipid Profile No results for input(s): CHOL, HDL, LDLCALC, TRIG, CHOLHDL, LDLDIRECT in the last 72 hours. Thyroid function studies Recent Labs    06/25/19 0516  TSH 0.515   Anemia work up No results for input(s): VITAMINB12, FOLATE, FERRITIN, TIBC, IRON, RETICCTPCT in the last 72 hours. Urinalysis    Component Value Date/Time   COLORURINE STRAW (A) 06/24/2019 0931   APPEARANCEUR CLEAR (A) 06/24/2019 0931   LABSPEC 1.012 06/24/2019 0931   PHURINE 7.0 06/24/2019 0931   GLUCOSEU NEGATIVE 06/24/2019 0931   HGBUR NEGATIVE 06/24/2019 0931   BILIRUBINUR NEGATIVE 06/24/2019 0931   KETONESUR 5 (A) 06/24/2019 0931   PROTEINUR NEGATIVE 06/24/2019 0931   NITRITE NEGATIVE 06/24/2019 0931   LEUKOCYTESUR NEGATIVE 06/24/2019 0931   Sepsis Labs Invalid input(s): PROCALCITONIN,  WBC,  LACTICIDVEN Microbiology Recent Results (from the past 240 hour(s))  Blood culture (routine x 2)     Status: None (Preliminary result)   Collection Time: 06/24/19  8:13 AM   Specimen: BLOOD RIGHT ARM  Result Value Ref Range Status   Specimen Description BLOOD RIGHT ARM  Final   Special Requests   Final    BOTTLES DRAWN AEROBIC AND ANAEROBIC Blood Culture adequate volume   Culture   Final    NO GROWTH 3 DAYS Performed at [facility], [address], [place], [place] [address]     Report Status PENDING  Incomplete  Blood culture (routine x 2)     Status: None (Preliminary result)   Collection Time: 06/24/19  8:14 AM   Specimen: BLOOD  Result Value Ref Range Status   Specimen Description BLOOD BLOOD LEFT FOREARM  Final   Special Requests   Final    BOTTLES DRAWN AEROBIC AND ANAEROBIC Blood Culture adequate volume   Culture   Final    NO GROWTH 3 DAYS Performed at [facility], [address] [place], [place] [address]    Report Status PENDING  Incomplete  Respiratory Panel by RT PCR (Flu A&B, Covid) -     Status: None   Collection Time: 06/24/19 11:41 AM  Result Value Ref Range Status   SARS Coronavirus 2 by RT PCR NEGATIVE NEGATIVE Final    Comment: (NOTE) SARS-CoV-2 target nucleic acids are NOT DETECTED. The SARS-CoV-2 RNA is generally detectable in upper respiratoy specimens during the acute phase of infection. The lowest concentration of SARS-CoV-2 viral copies this assay can detect is 131 copies/mL. A negative result does not preclude SARS-Cov-2 infection and should not be used as the sole basis for treatment or other patient management decisions. A negative result may occur with  improper specimen collection/handling, submission of specimen other than nasopharyngeal swab, presence of viral mutation(s) within the areas targeted by this assay, and inadequate number of viral copies (<131 copies/mL). A negative result must be combined with clinical observations, patient history, and epidemiological information. The  expected result is Negative. Fact Sheet for Patients:  PinkCheek.be Fact Sheet for Healthcare Providers:  GravelBags.it This test is not yet ap proved or cleared by the [place] FDA and  has been authorized for detection and/or diagnosis of SARS-CoV-2 by FDA under an Emergency Use Authorization (EUA). This EUA will remain  in effect (meaning this test can be used) for the  duration of the COVID-19 declaration under Section 564(b)(1) of the Act, 21 U.S.C. section 360bbb-3(b)(1), unless the authorization is terminated or revoked sooner.    Influenza A by PCR NEGATIVE NEGATIVE Final   Influenza B by PCR NEGATIVE NEGATIVE Final    Comment: (NOTE) The Xpert Xpress SARS-CoV-2/FLU/RSV assay is intended as an aid in  the diagnosis of influenza from Nasopharyngeal swab specimens and  should not be used as a sole basis for treatment. Nasal washings and  aspirates are unacceptable for Xpert Xpress SARS-CoV-2/FLU/RSV  testing. Fact Sheet for Patients: PinkCheek.be Fact Sheet for Healthcare Providers: GravelBags.it This test is not yet approved or cleared by the [place] FDA and  has been authorized for detection and/or diagnosis of SARS-CoV-2 by  FDA under an Emergency Use Authorization (EUA). This EUA will remain  in effect (meaning this test can be used) for the duration of the  Covid-19 declaration under Section 564(b)(1) of the Act, 21  U.S.C. section 360bbb-3(b)(1), unless the authorization is  terminated or revoked. Performed at [facility], [address] [place], [place] [address]      Time coordinating discharge: Over 40 minutes  SIGNED:   Charlynne Cousins, MD  [facility] 06/27/2019, 10:10 AM Pager   If 7PM-7AM, please contact night-coverage [email or web address] Password [email or web address]

## 2019-06-27 NOTE — Plan of Care (Signed)
  Problem: Clinical Measurements: Goal: Will remain free from infection Outcome: Progressing   Problem: Activity: Goal: Risk for activity intolerance will decrease Outcome: Progressing   Problem: Pain Managment: Goal: General experience of comfort will improve Outcome: Progressing   

## 2019-06-27 NOTE — Evaluation (Signed)
Physical Therapy Evaluation Patient Details Name: Ronald Trujillo MRN: [id number] DOB: June 12, 1960 Today's Date: 06/27/2019   History of Present Illness  59 y.o. male with medical history significant for anemia, arthritis, hypertension, CVA (with peripheral blindness) and dyslipidemia who was brought into the emergency room by EMS for evaluation of dizziness and multiple episodes of falls at home  Clinical Impression  Pt initially with a little unsteadiness and hesitancy, he did struggle to get up from bed on first attempt - but with cuing was able to improve this, and rose with single UE pushing from bed.  He ultimately was able to ambulate ~125 ft (With walker, uses SPC at baseline) and negotiate up/down flight of steps without direct assist.  He is not at his baseline and will benefit from [place], getting a FWW and having increased check-ins initially (paid aides vs neighbors/friends).  Pt did have some fatigue with HR into the high 120s and O2 (on room air) down to 92% but overall should be safe to return home.    Follow Up Recommendations Home health PT    Equipment Recommendations  Rolling walker with 5" wheels    Recommendations for Other Services       Precautions / Restrictions Precautions Precautions: Fall Restrictions Weight Bearing Restrictions: No      Mobility  Bed Mobility Overal bed mobility: Independent             General bed mobility comments: Pt able to get to sitting w/o assist of hesitation  Transfers Overall transfer level: Needs assistance Equipment used: Rolling walker (2 wheeled) Transfers: Sit to/from Stand Sit to Stand: Min assist;Min guard         General transfer comment: Pt fell back down on bed on first attempt to get up (using UEs on bed, unable to rise w/o AD).  Pt reliant on the walker to maintain standing balance initially.  Ambulation/Gait Ambulation/Gait assistance: Supervision Gait Distance (Feet): 125 Feet Assistive device:  Rolling walker (2 wheeled)       General Gait Details: Pt initially showed some unsteadiness with ambulation (had not been up in a while) but once he got going he showed increased confidence, safety and awareness  Stairs Stairs: Yes Stairs assistance: Min guard Stair Management: No rails;One rail Right;Backwards;Forwards Number of Stairs: 16 General stair comments: trialed retro-negotiation with walker as he does not have rail to enter, then able to ascend (L LE leading step-to) the reminder of steps, descent with R leading and single UE use.   Wheelchair Mobility    Modified Rankin (Stroke Patients Only)       Balance Overall balance assessment: Modified Independent                                           Pertinent Vitals/Pain      Home Living Family/patient expects to be discharged to:: Private residence Living Arrangements: Alone Available Help at Discharge: Available PRN/intermittently;Friend(s);Neighbor Type of Home: House Home Access: Stairs to enter Entrance Stairs-Rails: None Technical brewer of Steps: 3 Home Layout: Two level Home Equipment: Cane - single point      Prior Function Level of Independence: Independent with assistive device(s)         Comments: Pt does not drive but with a ride is able to do all he needs w/o daily assistance     Hand Dominance   Dominant Hand:  Right    Extremity/Trunk Assessment   Upper Extremity Assessment Upper Extremity Assessment: Overall WFL for tasks assessed    Lower Extremity Assessment Lower Extremity Assessment: Overall WFL for tasks assessed(c/o pain with resisted R knee ext, grossly 4/5)       Communication   Communication: No difficulties  Cognition Arousal/Alertness: Awake/alert Behavior During Therapy: WFL for tasks assessed/performed Overall Cognitive Status: Within Functional Limits for tasks assessed                                        General  Comments      Exercises     Assessment/Plan    PT Assessment Patient needs continued PT services  PT Problem List Decreased strength;Decreased balance;Decreased activity tolerance;Decreased mobility;Decreased safety awareness;Decreased knowledge of use of DME;Pain;Cardiopulmonary status limiting activity       PT Treatment Interventions DME instruction;Gait training;Stair training;Functional mobility training;Therapeutic activities;Therapeutic exercise;Balance training;Cognitive remediation;Patient/family education;Neuromuscular re-education    PT Goals (Current goals can be found in the Care Plan section)  Acute Rehab PT Goals Patient Stated Goal: Pt eager to go home, but still hesitant as he has not been doing a lot and feels weak/unsteady PT Goal Formulation: With patient Time For Goal Achievement: 07/11/19 Potential to Achieve Goals: Good    Frequency Min 2X/week   Barriers to discharge        Co-evaluation               AM-PAC PT "6 Clicks" Mobility  Outcome Measure Help needed turning from your back to your side while in a flat bed without using bedrails?: None Help needed moving from lying on your back to sitting on the side of a flat bed without using bedrails?: None Help needed moving to and from a bed to a chair (including a wheelchair)?: None Help needed standing up from a chair using your arms (e.g., wheelchair or bedside chair)?: None Help needed to walk in hospital room?: A Little Help needed climbing 3-5 steps with a railing? : A Little 6 Click Score: 22    End of Session Equipment Utilized During Treatment: Gait belt Activity Tolerance: Patient limited by fatigue(vitals steady for most of amb, O2 92%, HR 120s post steps) Patient left: in bed;with call bell/phone within reach Nurse Communication: Mobility status PT Visit Diagnosis: Muscle weakness (generalized) (M62.81);Difficulty in walking, not elsewhere classified (R26.2)    Time: [id number] PT  Time Calculation (min) (ACUTE ONLY): 33 min   Charges:   PT Evaluation $PT Eval Low Complexity: 1 Low PT Treatments $Gait Training: 8-22 mins        Kreg Shropshire, DPT 06/27/2019, 12:59 PM

## 2019-06-29 LAB — CULTURE, BLOOD (ROUTINE X 2)
Culture: NO GROWTH
Culture: NO GROWTH
Special Requests: ADEQUATE
Special Requests: ADEQUATE

## 2019-07-02 ENCOUNTER — Ambulatory Visit: Payer: PPO

## 2019-07-02 DIAGNOSIS — E538 Deficiency of other specified B group vitamins: Secondary | ICD-10-CM | POA: Diagnosis not present

## 2019-07-02 DIAGNOSIS — E1129 Type 2 diabetes mellitus with other diabetic kidney complication: Secondary | ICD-10-CM | POA: Diagnosis not present

## 2019-07-02 DIAGNOSIS — D649 Anemia, unspecified: Secondary | ICD-10-CM | POA: Diagnosis not present

## 2019-07-02 DIAGNOSIS — R809 Proteinuria, unspecified: Secondary | ICD-10-CM | POA: Diagnosis not present

## 2019-07-02 DIAGNOSIS — I10 Essential (primary) hypertension: Secondary | ICD-10-CM | POA: Diagnosis not present

## 2019-07-04 DIAGNOSIS — M1990 Unspecified osteoarthritis, unspecified site: Secondary | ICD-10-CM | POA: Diagnosis not present

## 2019-07-04 DIAGNOSIS — Z85828 Personal history of other malignant neoplasm of skin: Secondary | ICD-10-CM | POA: Diagnosis not present

## 2019-07-04 DIAGNOSIS — Z79891 Long term (current) use of opiate analgesic: Secondary | ICD-10-CM | POA: Diagnosis not present

## 2019-07-04 DIAGNOSIS — Z9181 History of falling: Secondary | ICD-10-CM | POA: Diagnosis not present

## 2019-07-04 DIAGNOSIS — E785 Hyperlipidemia, unspecified: Secondary | ICD-10-CM | POA: Diagnosis not present

## 2019-07-04 DIAGNOSIS — Z7902 Long term (current) use of antithrombotics/antiplatelets: Secondary | ICD-10-CM | POA: Diagnosis not present

## 2019-07-04 DIAGNOSIS — E119 Type 2 diabetes mellitus without complications: Secondary | ICD-10-CM | POA: Diagnosis not present

## 2019-07-04 DIAGNOSIS — I951 Orthostatic hypotension: Secondary | ICD-10-CM | POA: Diagnosis not present

## 2019-07-04 DIAGNOSIS — D649 Anemia, unspecified: Secondary | ICD-10-CM | POA: Diagnosis not present

## 2019-07-04 DIAGNOSIS — Z7984 Long term (current) use of oral hypoglycemic drugs: Secondary | ICD-10-CM | POA: Diagnosis not present

## 2019-07-04 DIAGNOSIS — I69398 Other sequelae of cerebral infarction: Secondary | ICD-10-CM | POA: Diagnosis not present

## 2019-07-04 DIAGNOSIS — H548 Legal blindness, as defined in USA: Secondary | ICD-10-CM | POA: Diagnosis not present

## 2019-07-04 DIAGNOSIS — I10 Essential (primary) hypertension: Secondary | ICD-10-CM | POA: Diagnosis not present

## 2019-07-05 DIAGNOSIS — S39012D Strain of muscle, fascia and tendon of lower back, subsequent encounter: Secondary | ICD-10-CM | POA: Diagnosis not present

## 2019-07-10 DIAGNOSIS — E119 Type 2 diabetes mellitus without complications: Secondary | ICD-10-CM | POA: Diagnosis not present

## 2019-07-10 DIAGNOSIS — Z7984 Long term (current) use of oral hypoglycemic drugs: Secondary | ICD-10-CM | POA: Diagnosis not present

## 2019-07-10 DIAGNOSIS — H548 Legal blindness, as defined in USA: Secondary | ICD-10-CM | POA: Diagnosis not present

## 2019-07-10 DIAGNOSIS — Z9181 History of falling: Secondary | ICD-10-CM | POA: Diagnosis not present

## 2019-07-10 DIAGNOSIS — I10 Essential (primary) hypertension: Secondary | ICD-10-CM | POA: Diagnosis not present

## 2019-07-10 DIAGNOSIS — D649 Anemia, unspecified: Secondary | ICD-10-CM | POA: Diagnosis not present

## 2019-07-10 DIAGNOSIS — Z7902 Long term (current) use of antithrombotics/antiplatelets: Secondary | ICD-10-CM | POA: Diagnosis not present

## 2019-07-10 DIAGNOSIS — I69398 Other sequelae of cerebral infarction: Secondary | ICD-10-CM | POA: Diagnosis not present

## 2019-07-10 DIAGNOSIS — I951 Orthostatic hypotension: Secondary | ICD-10-CM | POA: Diagnosis not present

## 2019-07-10 DIAGNOSIS — M1990 Unspecified osteoarthritis, unspecified site: Secondary | ICD-10-CM | POA: Diagnosis not present

## 2019-07-10 DIAGNOSIS — E785 Hyperlipidemia, unspecified: Secondary | ICD-10-CM | POA: Diagnosis not present

## 2019-07-10 DIAGNOSIS — Z79891 Long term (current) use of opiate analgesic: Secondary | ICD-10-CM | POA: Diagnosis not present

## 2019-07-10 DIAGNOSIS — Z85828 Personal history of other malignant neoplasm of skin: Secondary | ICD-10-CM | POA: Diagnosis not present

## 2019-07-15 DIAGNOSIS — E119 Type 2 diabetes mellitus without complications: Secondary | ICD-10-CM | POA: Diagnosis not present

## 2019-07-15 DIAGNOSIS — I10 Essential (primary) hypertension: Secondary | ICD-10-CM | POA: Diagnosis not present

## 2019-07-15 DIAGNOSIS — M1990 Unspecified osteoarthritis, unspecified site: Secondary | ICD-10-CM | POA: Diagnosis not present

## 2019-07-15 DIAGNOSIS — K921 Melena: Secondary | ICD-10-CM | POA: Diagnosis not present

## 2019-07-15 DIAGNOSIS — I69398 Other sequelae of cerebral infarction: Secondary | ICD-10-CM | POA: Diagnosis not present

## 2019-07-15 DIAGNOSIS — R195 Other fecal abnormalities: Secondary | ICD-10-CM | POA: Diagnosis not present

## 2019-07-15 DIAGNOSIS — I951 Orthostatic hypotension: Secondary | ICD-10-CM | POA: Diagnosis not present

## 2019-07-15 DIAGNOSIS — D649 Anemia, unspecified: Secondary | ICD-10-CM | POA: Diagnosis not present

## 2019-07-15 DIAGNOSIS — H548 Legal blindness, as defined in USA: Secondary | ICD-10-CM | POA: Diagnosis not present

## 2019-07-15 DIAGNOSIS — Z7901 Long term (current) use of anticoagulants: Secondary | ICD-10-CM | POA: Diagnosis not present

## 2019-07-16 DIAGNOSIS — I951 Orthostatic hypotension: Secondary | ICD-10-CM | POA: Diagnosis not present

## 2019-07-16 DIAGNOSIS — D649 Anemia, unspecified: Secondary | ICD-10-CM | POA: Diagnosis not present

## 2019-07-16 DIAGNOSIS — Z79891 Long term (current) use of opiate analgesic: Secondary | ICD-10-CM | POA: Diagnosis not present

## 2019-07-16 DIAGNOSIS — E119 Type 2 diabetes mellitus without complications: Secondary | ICD-10-CM | POA: Diagnosis not present

## 2019-07-16 DIAGNOSIS — Z9181 History of falling: Secondary | ICD-10-CM | POA: Diagnosis not present

## 2019-07-16 DIAGNOSIS — Z7902 Long term (current) use of antithrombotics/antiplatelets: Secondary | ICD-10-CM | POA: Diagnosis not present

## 2019-07-16 DIAGNOSIS — E785 Hyperlipidemia, unspecified: Secondary | ICD-10-CM | POA: Diagnosis not present

## 2019-07-16 DIAGNOSIS — H548 Legal blindness, as defined in USA: Secondary | ICD-10-CM | POA: Diagnosis not present

## 2019-07-16 DIAGNOSIS — M1990 Unspecified osteoarthritis, unspecified site: Secondary | ICD-10-CM | POA: Diagnosis not present

## 2019-07-16 DIAGNOSIS — I10 Essential (primary) hypertension: Secondary | ICD-10-CM | POA: Diagnosis not present

## 2019-07-16 DIAGNOSIS — I69398 Other sequelae of cerebral infarction: Secondary | ICD-10-CM | POA: Diagnosis not present

## 2019-07-16 DIAGNOSIS — Z85828 Personal history of other malignant neoplasm of skin: Secondary | ICD-10-CM | POA: Diagnosis not present

## 2019-07-16 DIAGNOSIS — Z7984 Long term (current) use of oral hypoglycemic drugs: Secondary | ICD-10-CM | POA: Diagnosis not present

## 2019-07-18 DIAGNOSIS — M48062 Spinal stenosis, lumbar region with neurogenic claudication: Secondary | ICD-10-CM | POA: Diagnosis not present

## 2019-07-18 DIAGNOSIS — M5416 Radiculopathy, lumbar region: Secondary | ICD-10-CM | POA: Diagnosis not present

## 2019-07-18 DIAGNOSIS — M5136 Other intervertebral disc degeneration, lumbar region: Secondary | ICD-10-CM | POA: Diagnosis not present

## 2019-07-23 DIAGNOSIS — M1990 Unspecified osteoarthritis, unspecified site: Secondary | ICD-10-CM | POA: Diagnosis not present

## 2019-07-23 DIAGNOSIS — I951 Orthostatic hypotension: Secondary | ICD-10-CM | POA: Diagnosis not present

## 2019-07-23 DIAGNOSIS — Z85828 Personal history of other malignant neoplasm of skin: Secondary | ICD-10-CM | POA: Diagnosis not present

## 2019-07-23 DIAGNOSIS — I69398 Other sequelae of cerebral infarction: Secondary | ICD-10-CM | POA: Diagnosis not present

## 2019-07-23 DIAGNOSIS — D649 Anemia, unspecified: Secondary | ICD-10-CM | POA: Diagnosis not present

## 2019-07-23 DIAGNOSIS — Z7902 Long term (current) use of antithrombotics/antiplatelets: Secondary | ICD-10-CM | POA: Diagnosis not present

## 2019-07-23 DIAGNOSIS — Z79891 Long term (current) use of opiate analgesic: Secondary | ICD-10-CM | POA: Diagnosis not present

## 2019-07-23 DIAGNOSIS — Z9181 History of falling: Secondary | ICD-10-CM | POA: Diagnosis not present

## 2019-07-23 DIAGNOSIS — Z7984 Long term (current) use of oral hypoglycemic drugs: Secondary | ICD-10-CM | POA: Diagnosis not present

## 2019-07-23 DIAGNOSIS — E785 Hyperlipidemia, unspecified: Secondary | ICD-10-CM | POA: Diagnosis not present

## 2019-07-23 DIAGNOSIS — E119 Type 2 diabetes mellitus without complications: Secondary | ICD-10-CM | POA: Diagnosis not present

## 2019-07-23 DIAGNOSIS — I10 Essential (primary) hypertension: Secondary | ICD-10-CM | POA: Diagnosis not present

## 2019-07-23 DIAGNOSIS — H548 Legal blindness, as defined in USA: Secondary | ICD-10-CM | POA: Diagnosis not present

## 2019-07-25 DIAGNOSIS — Z7902 Long term (current) use of antithrombotics/antiplatelets: Secondary | ICD-10-CM | POA: Diagnosis not present

## 2019-07-25 DIAGNOSIS — H548 Legal blindness, as defined in USA: Secondary | ICD-10-CM | POA: Diagnosis not present

## 2019-07-25 DIAGNOSIS — Z7984 Long term (current) use of oral hypoglycemic drugs: Secondary | ICD-10-CM | POA: Diagnosis not present

## 2019-07-25 DIAGNOSIS — Z85828 Personal history of other malignant neoplasm of skin: Secondary | ICD-10-CM | POA: Diagnosis not present

## 2019-07-25 DIAGNOSIS — Z79891 Long term (current) use of opiate analgesic: Secondary | ICD-10-CM | POA: Diagnosis not present

## 2019-07-25 DIAGNOSIS — D649 Anemia, unspecified: Secondary | ICD-10-CM | POA: Diagnosis not present

## 2019-07-25 DIAGNOSIS — I951 Orthostatic hypotension: Secondary | ICD-10-CM | POA: Diagnosis not present

## 2019-07-25 DIAGNOSIS — E785 Hyperlipidemia, unspecified: Secondary | ICD-10-CM | POA: Diagnosis not present

## 2019-07-25 DIAGNOSIS — M1990 Unspecified osteoarthritis, unspecified site: Secondary | ICD-10-CM | POA: Diagnosis not present

## 2019-07-25 DIAGNOSIS — I10 Essential (primary) hypertension: Secondary | ICD-10-CM | POA: Diagnosis not present

## 2019-07-25 DIAGNOSIS — Z9181 History of falling: Secondary | ICD-10-CM | POA: Diagnosis not present

## 2019-07-25 DIAGNOSIS — I69398 Other sequelae of cerebral infarction: Secondary | ICD-10-CM | POA: Diagnosis not present

## 2019-07-25 DIAGNOSIS — E119 Type 2 diabetes mellitus without complications: Secondary | ICD-10-CM | POA: Diagnosis not present

## 2019-08-01 DIAGNOSIS — M1990 Unspecified osteoarthritis, unspecified site: Secondary | ICD-10-CM | POA: Diagnosis not present

## 2019-08-01 DIAGNOSIS — Z7902 Long term (current) use of antithrombotics/antiplatelets: Secondary | ICD-10-CM | POA: Diagnosis not present

## 2019-08-01 DIAGNOSIS — H548 Legal blindness, as defined in USA: Secondary | ICD-10-CM | POA: Diagnosis not present

## 2019-08-01 DIAGNOSIS — I951 Orthostatic hypotension: Secondary | ICD-10-CM | POA: Diagnosis not present

## 2019-08-01 DIAGNOSIS — E785 Hyperlipidemia, unspecified: Secondary | ICD-10-CM | POA: Diagnosis not present

## 2019-08-01 DIAGNOSIS — I10 Essential (primary) hypertension: Secondary | ICD-10-CM | POA: Diagnosis not present

## 2019-08-01 DIAGNOSIS — Z9181 History of falling: Secondary | ICD-10-CM | POA: Diagnosis not present

## 2019-08-01 DIAGNOSIS — D649 Anemia, unspecified: Secondary | ICD-10-CM | POA: Diagnosis not present

## 2019-08-01 DIAGNOSIS — Z85828 Personal history of other malignant neoplasm of skin: Secondary | ICD-10-CM | POA: Diagnosis not present

## 2019-08-01 DIAGNOSIS — Z7984 Long term (current) use of oral hypoglycemic drugs: Secondary | ICD-10-CM | POA: Diagnosis not present

## 2019-08-01 DIAGNOSIS — I69398 Other sequelae of cerebral infarction: Secondary | ICD-10-CM | POA: Diagnosis not present

## 2019-08-01 DIAGNOSIS — Z79891 Long term (current) use of opiate analgesic: Secondary | ICD-10-CM | POA: Diagnosis not present

## 2019-08-01 DIAGNOSIS — E119 Type 2 diabetes mellitus without complications: Secondary | ICD-10-CM | POA: Diagnosis not present

## 2019-08-02 DIAGNOSIS — E785 Hyperlipidemia, unspecified: Secondary | ICD-10-CM | POA: Diagnosis not present

## 2019-08-02 DIAGNOSIS — I69398 Other sequelae of cerebral infarction: Secondary | ICD-10-CM | POA: Diagnosis not present

## 2019-08-02 DIAGNOSIS — Z7902 Long term (current) use of antithrombotics/antiplatelets: Secondary | ICD-10-CM | POA: Diagnosis not present

## 2019-08-02 DIAGNOSIS — D649 Anemia, unspecified: Secondary | ICD-10-CM | POA: Diagnosis not present

## 2019-08-02 DIAGNOSIS — M1990 Unspecified osteoarthritis, unspecified site: Secondary | ICD-10-CM | POA: Diagnosis not present

## 2019-08-02 DIAGNOSIS — Z79891 Long term (current) use of opiate analgesic: Secondary | ICD-10-CM | POA: Diagnosis not present

## 2019-08-02 DIAGNOSIS — Z85828 Personal history of other malignant neoplasm of skin: Secondary | ICD-10-CM | POA: Diagnosis not present

## 2019-08-02 DIAGNOSIS — I951 Orthostatic hypotension: Secondary | ICD-10-CM | POA: Diagnosis not present

## 2019-08-02 DIAGNOSIS — Z9181 History of falling: Secondary | ICD-10-CM | POA: Diagnosis not present

## 2019-08-02 DIAGNOSIS — H548 Legal blindness, as defined in USA: Secondary | ICD-10-CM | POA: Diagnosis not present

## 2019-08-02 DIAGNOSIS — Z7984 Long term (current) use of oral hypoglycemic drugs: Secondary | ICD-10-CM | POA: Diagnosis not present

## 2019-08-02 DIAGNOSIS — I10 Essential (primary) hypertension: Secondary | ICD-10-CM | POA: Diagnosis not present

## 2019-08-02 DIAGNOSIS — E119 Type 2 diabetes mellitus without complications: Secondary | ICD-10-CM | POA: Diagnosis not present

## 2019-08-08 DIAGNOSIS — H548 Legal blindness, as defined in USA: Secondary | ICD-10-CM | POA: Diagnosis not present

## 2019-08-08 DIAGNOSIS — M1990 Unspecified osteoarthritis, unspecified site: Secondary | ICD-10-CM | POA: Diagnosis not present

## 2019-08-08 DIAGNOSIS — I951 Orthostatic hypotension: Secondary | ICD-10-CM | POA: Diagnosis not present

## 2019-08-08 DIAGNOSIS — I10 Essential (primary) hypertension: Secondary | ICD-10-CM | POA: Diagnosis not present

## 2019-08-08 DIAGNOSIS — Z85828 Personal history of other malignant neoplasm of skin: Secondary | ICD-10-CM | POA: Diagnosis not present

## 2019-08-08 DIAGNOSIS — I69398 Other sequelae of cerebral infarction: Secondary | ICD-10-CM | POA: Diagnosis not present

## 2019-08-08 DIAGNOSIS — Z79891 Long term (current) use of opiate analgesic: Secondary | ICD-10-CM | POA: Diagnosis not present

## 2019-08-08 DIAGNOSIS — Z7902 Long term (current) use of antithrombotics/antiplatelets: Secondary | ICD-10-CM | POA: Diagnosis not present

## 2019-08-08 DIAGNOSIS — E119 Type 2 diabetes mellitus without complications: Secondary | ICD-10-CM | POA: Diagnosis not present

## 2019-08-08 DIAGNOSIS — E785 Hyperlipidemia, unspecified: Secondary | ICD-10-CM | POA: Diagnosis not present

## 2019-08-08 DIAGNOSIS — Z9181 History of falling: Secondary | ICD-10-CM | POA: Diagnosis not present

## 2019-08-08 DIAGNOSIS — D649 Anemia, unspecified: Secondary | ICD-10-CM | POA: Diagnosis not present

## 2019-08-08 DIAGNOSIS — Z7984 Long term (current) use of oral hypoglycemic drugs: Secondary | ICD-10-CM | POA: Diagnosis not present

## 2019-08-12 ENCOUNTER — Other Ambulatory Visit
Admission: RE | Admit: 2019-08-12 | Discharge: 2019-08-12 | Disposition: A | Discharge status: Home / Self Care | Payer: PPO | Source: Ambulatory Visit | Attending: Internal Medicine | Admitting: Internal Medicine

## 2019-08-12 ENCOUNTER — Other Ambulatory Visit: Payer: Self-pay

## 2019-08-12 DIAGNOSIS — Z01812 Encounter for preprocedural laboratory examination: Secondary | ICD-10-CM | POA: Diagnosis not present

## 2019-08-12 DIAGNOSIS — Z20822 Contact with and (suspected) exposure to covid-19: Secondary | ICD-10-CM | POA: Insufficient documentation

## 2019-08-12 LAB — SARS CORONAVIRUS 2 (TAT 6-24 HRS): SARS Coronavirus 2: NEGATIVE

## 2019-08-13 ENCOUNTER — Encounter: Payer: Self-pay | Admitting: Internal Medicine

## 2019-08-14 ENCOUNTER — Ambulatory Visit: Payer: PPO | Admitting: Certified Registered Nurse Anesthetist

## 2019-08-14 ENCOUNTER — Encounter: Payer: Self-pay | Admitting: Internal Medicine

## 2019-08-14 ENCOUNTER — Ambulatory Visit
Admission: RE | Admit: 2019-08-14 | Discharge: 2019-08-14 | Disposition: A | Discharge status: Home / Self Care | Payer: PPO | Attending: Internal Medicine | Admitting: Internal Medicine

## 2019-08-14 ENCOUNTER — Other Ambulatory Visit: Payer: Self-pay

## 2019-08-14 ENCOUNTER — Encounter
Admission: RE | Disposition: A | Discharge status: Home / Self Care | Payer: Self-pay | Source: Home / Self Care | Attending: Internal Medicine

## 2019-08-14 DIAGNOSIS — K449 Diaphragmatic hernia without obstruction or gangrene: Secondary | ICD-10-CM | POA: Diagnosis not present

## 2019-08-14 DIAGNOSIS — I10 Essential (primary) hypertension: Secondary | ICD-10-CM | POA: Insufficient documentation

## 2019-08-14 DIAGNOSIS — D500 Iron deficiency anemia secondary to blood loss (chronic): Secondary | ICD-10-CM | POA: Insufficient documentation

## 2019-08-14 DIAGNOSIS — M1990 Unspecified osteoarthritis, unspecified site: Secondary | ICD-10-CM | POA: Diagnosis not present

## 2019-08-14 DIAGNOSIS — Z7984 Long term (current) use of oral hypoglycemic drugs: Secondary | ICD-10-CM | POA: Diagnosis not present

## 2019-08-14 DIAGNOSIS — K219 Gastro-esophageal reflux disease without esophagitis: Secondary | ICD-10-CM | POA: Diagnosis not present

## 2019-08-14 DIAGNOSIS — H548 Legal blindness, as defined in USA: Secondary | ICD-10-CM | POA: Diagnosis not present

## 2019-08-14 DIAGNOSIS — Z8673 Personal history of transient ischemic attack (TIA), and cerebral infarction without residual deficits: Secondary | ICD-10-CM | POA: Insufficient documentation

## 2019-08-14 DIAGNOSIS — E785 Hyperlipidemia, unspecified: Secondary | ICD-10-CM | POA: Diagnosis not present

## 2019-08-14 DIAGNOSIS — K2100 Gastro-esophageal reflux disease with esophagitis, without bleeding: Secondary | ICD-10-CM | POA: Diagnosis not present

## 2019-08-14 DIAGNOSIS — Z85828 Personal history of other malignant neoplasm of skin: Secondary | ICD-10-CM | POA: Diagnosis not present

## 2019-08-14 DIAGNOSIS — Z79899 Other long term (current) drug therapy: Secondary | ICD-10-CM | POA: Diagnosis not present

## 2019-08-14 DIAGNOSIS — K921 Melena: Secondary | ICD-10-CM | POA: Diagnosis not present

## 2019-08-14 DIAGNOSIS — K2270 Barrett's esophagus without dysplasia: Secondary | ICD-10-CM | POA: Diagnosis not present

## 2019-08-14 DIAGNOSIS — E119 Type 2 diabetes mellitus without complications: Secondary | ICD-10-CM | POA: Insufficient documentation

## 2019-08-14 DIAGNOSIS — Z7982 Long term (current) use of aspirin: Secondary | ICD-10-CM | POA: Insufficient documentation

## 2019-08-14 HISTORY — PX: ESOPHAGOGASTRODUODENOSCOPY (EGD) WITH PROPOFOL: SHX5813

## 2019-08-14 HISTORY — DX: Dysphonia: R49.0

## 2019-08-14 LAB — GLUCOSE, CAPILLARY: Glucose-Capillary: 101 mg/dL — ABNORMAL HIGH (ref 70–99)

## 2019-08-14 SURGERY — ESOPHAGOGASTRODUODENOSCOPY (EGD) WITH PROPOFOL
Anesthesia: General

## 2019-08-14 MED ORDER — SODIUM CHLORIDE 0.9 % IV SOLN: INTRAVENOUS | Status: DC

## 2019-08-14 MED ORDER — PROPOFOL 10 MG/ML IV BOLUS
INTRAVENOUS | Status: AC
  Filled 2019-08-14: qty 80

## 2019-08-14 MED ORDER — PROPOFOL 10 MG/ML IV BOLUS
INTRAVENOUS | Status: DC | PRN
  Administered 2019-08-14: 20 mg via INTRAVENOUS
  Administered 2019-08-14: 30 mg via INTRAVENOUS
  Administered 2019-08-14: 70 mg via INTRAVENOUS
  Administered 2019-08-14: 30 mg via INTRAVENOUS

## 2019-08-14 MED ORDER — LIDOCAINE HCL (CARDIAC) PF 100 MG/5ML IV SOSY
PREFILLED_SYRINGE | INTRAVENOUS | Status: DC | PRN
  Administered 2019-08-14: 100 mg via INTRAVENOUS

## 2019-08-14 NOTE — Interval H&P Note (Signed)
History and Physical Interval Note:  08/14/2019 9:01 AM  Ronald Trujillo  has presented today for surgery, with the diagnosis of HEME + STOOL.  The various methods of treatment have been discussed with the patient and family. After consideration of risks, benefits and other options for treatment, the patient has consented to  Procedure(s): ESOPHAGOGASTRODUODENOSCOPY (EGD) WITH PROPOFOL (N/A) as a surgical intervention.  The patient's history has been reviewed, patient examined, no change in status, stable for surgery.  I have reviewed the patient's chart and labs.  Questions were answered to the patient's satisfaction.     [place], [place]

## 2019-08-14 NOTE — Op Note (Signed)
[facility] Gastroenterology Patient Name: Ronald Trujillo Procedure Date: 08/14/2019 8:56 AM MRN: [id number] Account #: [id number] Date of Birth: June 14, 1960 Admit Type: Outpatient Age: 59 Room: [facility] ENDO ROOM 3 Gender: Male Note Status: Finalized Procedure:             Upper GI endoscopy Indications:           Iron deficiency anemia secondary to chronic blood                         loss, Hematochezia, Melena Providers:             Benay Pike. Alice Reichert MD, MD Referring MD:          Ronald Lab, MD (Referring MD) Medicines:             Propofol per Anesthesia Complications:         No immediate complications. Procedure:             Pre-Anesthesia Assessment:                        - The risks and benefits of the procedure and the                         sedation options and risks were discussed with the                         patient. All questions were answered and informed                         consent was obtained.                        - Patient identification and proposed procedure were                         verified prior to the procedure by the nurse. The                         procedure was verified in the procedure room.                        - ASA Grade Assessment: III - A patient with severe                         systemic disease.                        - After reviewing the risks and benefits, the patient                         was deemed in satisfactory condition to undergo the                         procedure.                        After obtaining informed consent, the endoscope was                         passed under direct vision. Throughout  the procedure,                         the patient's blood pressure, pulse, and oxygen                         saturations were monitored continuously. The Endoscope                         was introduced through the mouth, and advanced to the                         third part of duodenum. The upper  GI endoscopy was                         accomplished without difficulty. The patient tolerated                         the procedure well. Findings:      There were esophageal mucosal changes consistent with long-segment       Barrett's esophagus present in the lower third of the esophagus. The       maximum longitudinal extent of these mucosal changes was 6 cm in length.       Mucosa was biopsied with a cold forceps for histology in a targeted       manner at intervals of 2 cm from 29 to 35 cm from the incisors. A total       of 4 specimen bottles were sent to pathology.      A 2 cm hiatal hernia was present.      The examined duodenum was normal.      The exam was otherwise without abnormality. Impression:            - Esophageal mucosal changes consistent with                         long-segment Barrett's esophagus. Biopsied.                        - 2 cm hiatal hernia.                        - Normal examined duodenum.                        - The examination was otherwise normal. Recommendation:        - Patient has a contact number available for                         emergencies. The signs and symptoms of potential                         delayed complications were discussed with the patient.                         Return to normal activities tomorrow. Written                         discharge instructions were provided to the patient.                        -  Resume previous diet.                        - Continue present medications.                        - Await pathology results.                        - To visualize the small bowel, perform video capsule                         endoscopy at appointment to be scheduled.                        - Return to physician assistant in 3 weeks.                        - Please follow up with Ronald Berkshire, PA-C in 6                         weeks                        - The findings and recommendations were discussed with                          the patient. Procedure Code(s):     --- Professional ---                        [phone], Esophagogastroduodenoscopy, flexible,                         transoral; with biopsy, single or multiple Diagnosis Code(s):     --- Professional ---                        K92.1, Melena (includes Hematochezia)                        D50.0, Iron deficiency anemia secondary to blood loss                         (chronic)                        K44.9, Diaphragmatic hernia without obstruction or                         gangrene                        K22.8, Other specified diseases of esophagus CPT copyright 2019 American Medical Association. All rights reserved. The codes documented in this report are preliminary and upon coder review may  be revised to meet current compliance requirements. Ronald Sella MD, MD 08/14/2019 9:41:52 AM This report has been signed electronically. Number of Addenda: 0 Note Initiated On: 08/14/2019 8:56 AM Estimated Blood Loss:  Estimated blood loss: none.      [facility]

## 2019-08-14 NOTE — Interval H&P Note (Signed)
History and Physical Interval Note:  08/14/2019 9:01 AM  Ronald Trujillo  has presented today for surgery, with the diagnosis of HEME + STOOL.  The various methods of treatment have been discussed with the patient and family. After consideration of risks, benefits and other options for treatment, the patient has consented to  Procedure(s): ESOPHAGOGASTRODUODENOSCOPY (EGD) WITH PROPOFOL (N/A) as a surgical intervention.  The patient's history has been reviewed, patient examined, no change in status, stable for surgery.  I have reviewed the patient's chart and labs.  Questions were answered to the patient's satisfaction.     [place], [place]
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
## 2019-08-14 NOTE — Anesthesia Postprocedure Evaluation (Signed)
Anesthesia Post Note  Patient: Ronald Trujillo  Procedure(s) Performed: ESOPHAGOGASTRODUODENOSCOPY (EGD) WITH PROPOFOL (N/A )  Patient location during evaluation: Endoscopy Anesthesia Type: General Level of consciousness: awake and alert and oriented Pain management: pain level controlled Vital Signs Assessment: post-procedure vital signs reviewed and stable Respiratory status: spontaneous breathing, nonlabored ventilation and respiratory function stable Cardiovascular status: blood pressure returned to baseline and stable Postop Assessment: no signs of nausea or vomiting Anesthetic complications: no     Last Vitals:  Vitals:   08/14/19 0947 08/14/19 0950  BP: (!) 148/84 (!) 149/87  Pulse: (!) 52   Resp: 15   Temp:    SpO2: 99%     Last Pain:  Vitals:   08/14/19 1000  TempSrc:   PainSc: 0-No pain                 Dashanae Longfield

## 2019-08-14 NOTE — Addendum Note (Signed)
Addendum  created 08/14/19 1638 by Hedda Slade, CRNA   Intraprocedure Event deleted
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
## 2019-08-14 NOTE — Transfer of Care (Signed)
Immediate Anesthesia Transfer of Care Note  Patient: Ronald Trujillo  Procedure(s) Performed: ESOPHAGOGASTRODUODENOSCOPY (EGD) WITH PROPOFOL (N/A )  Patient Location: PACU and Endoscopy Unit  Anesthesia Type:General  Level of Consciousness: awake, drowsy and patient cooperative  Airway & Oxygen Therapy: Patient Spontanous Breathing  Post-op Assessment: Report given to RN, Post -op Vital signs reviewed and stable and Patient moving all extremities X 4  Post vital signs: Reviewed and stable  Last Vitals:  Vitals Value Taken Time  BP 145/87 08/14/19 0920  Temp 36.6 C 08/14/19 0920  Pulse 65 08/14/19 0920  Resp 14 08/14/19 0920  SpO2 93 % 08/14/19 0920    Last Pain:  Vitals:   08/14/19 0920  TempSrc: Temporal  PainSc: Asleep         Complications: No apparent anesthesia complications

## 2019-08-15 ENCOUNTER — Encounter: Payer: Self-pay | Admitting: Internal Medicine

## 2019-08-15 DIAGNOSIS — E785 Hyperlipidemia, unspecified: Secondary | ICD-10-CM | POA: Diagnosis not present

## 2019-08-15 DIAGNOSIS — D649 Anemia, unspecified: Secondary | ICD-10-CM | POA: Diagnosis not present

## 2019-08-15 DIAGNOSIS — I69398 Other sequelae of cerebral infarction: Secondary | ICD-10-CM | POA: Diagnosis not present

## 2019-08-15 DIAGNOSIS — H548 Legal blindness, as defined in USA: Secondary | ICD-10-CM | POA: Diagnosis not present

## 2019-08-15 DIAGNOSIS — Z7902 Long term (current) use of antithrombotics/antiplatelets: Secondary | ICD-10-CM | POA: Diagnosis not present

## 2019-08-15 DIAGNOSIS — Z79891 Long term (current) use of opiate analgesic: Secondary | ICD-10-CM | POA: Diagnosis not present

## 2019-08-15 DIAGNOSIS — Z85828 Personal history of other malignant neoplasm of skin: Secondary | ICD-10-CM | POA: Diagnosis not present

## 2019-08-15 DIAGNOSIS — Z9181 History of falling: Secondary | ICD-10-CM | POA: Diagnosis not present

## 2019-08-15 DIAGNOSIS — Z7984 Long term (current) use of oral hypoglycemic drugs: Secondary | ICD-10-CM | POA: Diagnosis not present

## 2019-08-15 DIAGNOSIS — G43711 Chronic migraine without aura, intractable, with status migrainosus: Secondary | ICD-10-CM | POA: Diagnosis not present

## 2019-08-15 DIAGNOSIS — M1990 Unspecified osteoarthritis, unspecified site: Secondary | ICD-10-CM | POA: Diagnosis not present

## 2019-08-15 DIAGNOSIS — I951 Orthostatic hypotension: Secondary | ICD-10-CM | POA: Diagnosis not present

## 2019-08-15 DIAGNOSIS — I10 Essential (primary) hypertension: Secondary | ICD-10-CM | POA: Diagnosis not present

## 2019-08-15 DIAGNOSIS — M5416 Radiculopathy, lumbar region: Secondary | ICD-10-CM | POA: Diagnosis not present

## 2019-08-15 DIAGNOSIS — E119 Type 2 diabetes mellitus without complications: Secondary | ICD-10-CM | POA: Diagnosis not present

## 2019-08-16 LAB — SURGICAL PATHOLOGY

## 2019-08-21 DIAGNOSIS — I951 Orthostatic hypotension: Secondary | ICD-10-CM | POA: Diagnosis not present

## 2019-08-21 DIAGNOSIS — Z7902 Long term (current) use of antithrombotics/antiplatelets: Secondary | ICD-10-CM | POA: Diagnosis not present

## 2019-08-21 DIAGNOSIS — Z9181 History of falling: Secondary | ICD-10-CM | POA: Diagnosis not present

## 2019-08-21 DIAGNOSIS — D649 Anemia, unspecified: Secondary | ICD-10-CM | POA: Diagnosis not present

## 2019-08-21 DIAGNOSIS — E538 Deficiency of other specified B group vitamins: Secondary | ICD-10-CM | POA: Diagnosis not present

## 2019-08-21 DIAGNOSIS — E119 Type 2 diabetes mellitus without complications: Secondary | ICD-10-CM | POA: Diagnosis not present

## 2019-08-21 DIAGNOSIS — I69398 Other sequelae of cerebral infarction: Secondary | ICD-10-CM | POA: Diagnosis not present

## 2019-08-21 DIAGNOSIS — E7849 Other hyperlipidemia: Secondary | ICD-10-CM | POA: Diagnosis not present

## 2019-08-21 DIAGNOSIS — I10 Essential (primary) hypertension: Secondary | ICD-10-CM | POA: Diagnosis not present

## 2019-08-21 DIAGNOSIS — H548 Legal blindness, as defined in USA: Secondary | ICD-10-CM | POA: Diagnosis not present

## 2019-08-21 DIAGNOSIS — E1129 Type 2 diabetes mellitus with other diabetic kidney complication: Secondary | ICD-10-CM | POA: Diagnosis not present

## 2019-08-21 DIAGNOSIS — M1990 Unspecified osteoarthritis, unspecified site: Secondary | ICD-10-CM | POA: Diagnosis not present

## 2019-08-21 DIAGNOSIS — Z7984 Long term (current) use of oral hypoglycemic drugs: Secondary | ICD-10-CM | POA: Diagnosis not present

## 2019-08-21 DIAGNOSIS — Z79891 Long term (current) use of opiate analgesic: Secondary | ICD-10-CM | POA: Diagnosis not present

## 2019-08-21 DIAGNOSIS — E785 Hyperlipidemia, unspecified: Secondary | ICD-10-CM | POA: Diagnosis not present

## 2019-08-21 DIAGNOSIS — R809 Proteinuria, unspecified: Secondary | ICD-10-CM | POA: Diagnosis not present

## 2019-08-21 DIAGNOSIS — Z85828 Personal history of other malignant neoplasm of skin: Secondary | ICD-10-CM | POA: Diagnosis not present

## 2019-08-27 DIAGNOSIS — M5416 Radiculopathy, lumbar region: Secondary | ICD-10-CM | POA: Diagnosis not present

## 2019-08-28 DIAGNOSIS — E1129 Type 2 diabetes mellitus with other diabetic kidney complication: Secondary | ICD-10-CM | POA: Diagnosis not present

## 2019-08-28 DIAGNOSIS — E538 Deficiency of other specified B group vitamins: Secondary | ICD-10-CM | POA: Diagnosis not present

## 2019-08-28 DIAGNOSIS — Z86007 Personal history of in-situ neoplasm of skin: Secondary | ICD-10-CM | POA: Diagnosis not present

## 2019-08-28 DIAGNOSIS — R809 Proteinuria, unspecified: Secondary | ICD-10-CM | POA: Diagnosis not present

## 2019-08-28 DIAGNOSIS — I10 Essential (primary) hypertension: Secondary | ICD-10-CM | POA: Diagnosis not present

## 2019-08-28 DIAGNOSIS — E7849 Other hyperlipidemia: Secondary | ICD-10-CM | POA: Diagnosis not present

## 2019-08-28 DIAGNOSIS — D649 Anemia, unspecified: Secondary | ICD-10-CM | POA: Diagnosis not present

## 2019-09-10 DIAGNOSIS — M5416 Radiculopathy, lumbar region: Secondary | ICD-10-CM | POA: Diagnosis not present

## 2019-09-17 DIAGNOSIS — M5416 Radiculopathy, lumbar region: Secondary | ICD-10-CM | POA: Diagnosis not present

## 2019-10-09 DIAGNOSIS — H0011 Chalazion right upper eyelid: Secondary | ICD-10-CM | POA: Diagnosis not present

## 2019-10-23 DIAGNOSIS — B351 Tinea unguium: Secondary | ICD-10-CM | POA: Diagnosis not present

## 2019-10-23 DIAGNOSIS — E1142 Type 2 diabetes mellitus with diabetic polyneuropathy: Secondary | ICD-10-CM | POA: Diagnosis not present

## 2019-11-26 DIAGNOSIS — G43711 Chronic migraine without aura, intractable, with status migrainosus: Secondary | ICD-10-CM | POA: Diagnosis not present

## 2019-11-26 DIAGNOSIS — Z8673 Personal history of transient ischemic attack (TIA), and cerebral infarction without residual deficits: Secondary | ICD-10-CM | POA: Diagnosis not present

## 2019-11-28 DIAGNOSIS — H47013 Ischemic optic neuropathy, bilateral: Secondary | ICD-10-CM | POA: Diagnosis not present

## 2019-12-03 DIAGNOSIS — E538 Deficiency of other specified B group vitamins: Secondary | ICD-10-CM | POA: Diagnosis not present

## 2019-12-03 DIAGNOSIS — D649 Anemia, unspecified: Secondary | ICD-10-CM | POA: Diagnosis not present

## 2019-12-03 DIAGNOSIS — R809 Proteinuria, unspecified: Secondary | ICD-10-CM | POA: Diagnosis not present

## 2019-12-03 DIAGNOSIS — M5416 Radiculopathy, lumbar region: Secondary | ICD-10-CM | POA: Diagnosis not present

## 2019-12-03 DIAGNOSIS — Z86007 Personal history of in-situ neoplasm of skin: Secondary | ICD-10-CM | POA: Diagnosis not present

## 2019-12-03 DIAGNOSIS — I10 Essential (primary) hypertension: Secondary | ICD-10-CM | POA: Diagnosis not present

## 2019-12-03 DIAGNOSIS — R490 Dysphonia: Secondary | ICD-10-CM | POA: Diagnosis not present

## 2019-12-03 DIAGNOSIS — E7849 Other hyperlipidemia: Secondary | ICD-10-CM | POA: Diagnosis not present

## 2019-12-03 DIAGNOSIS — E1129 Type 2 diabetes mellitus with other diabetic kidney complication: Secondary | ICD-10-CM | POA: Diagnosis not present

## 2019-12-03 DIAGNOSIS — Z23 Encounter for immunization: Secondary | ICD-10-CM | POA: Diagnosis not present

## 2019-12-23 DIAGNOSIS — M94261 Chondromalacia, right knee: Secondary | ICD-10-CM | POA: Diagnosis not present

## 2020-01-01 DIAGNOSIS — Z86004 Personal history of in-situ neoplasm of other and unspecified digestive organs: Secondary | ICD-10-CM | POA: Diagnosis not present

## 2020-01-09 DIAGNOSIS — R519 Headache, unspecified: Secondary | ICD-10-CM | POA: Diagnosis not present

## 2020-01-09 DIAGNOSIS — Q180 Sinus, fistula and cyst of branchial cleft: Secondary | ICD-10-CM | POA: Diagnosis not present

## 2020-01-09 DIAGNOSIS — L309 Dermatitis, unspecified: Secondary | ICD-10-CM | POA: Diagnosis not present

## 2020-01-10 DIAGNOSIS — R519 Headache, unspecified: Secondary | ICD-10-CM | POA: Diagnosis not present

## 2020-01-14 DIAGNOSIS — K22710 Barrett's esophagus with low grade dysplasia: Secondary | ICD-10-CM | POA: Diagnosis not present

## 2020-01-21 DIAGNOSIS — Z8673 Personal history of transient ischemic attack (TIA), and cerebral infarction without residual deficits: Secondary | ICD-10-CM | POA: Diagnosis not present

## 2020-01-21 DIAGNOSIS — G43711 Chronic migraine without aura, intractable, with status migrainosus: Secondary | ICD-10-CM | POA: Diagnosis not present

## 2020-01-29 DIAGNOSIS — E1142 Type 2 diabetes mellitus with diabetic polyneuropathy: Secondary | ICD-10-CM | POA: Diagnosis not present

## 2020-01-29 DIAGNOSIS — Q828 Other specified congenital malformations of skin: Secondary | ICD-10-CM | POA: Diagnosis not present

## 2020-01-29 DIAGNOSIS — D2372 Other benign neoplasm of skin of left lower limb, including hip: Secondary | ICD-10-CM | POA: Diagnosis not present

## 2020-01-29 DIAGNOSIS — M79674 Pain in right toe(s): Secondary | ICD-10-CM | POA: Diagnosis not present

## 2020-01-29 DIAGNOSIS — M79675 Pain in left toe(s): Secondary | ICD-10-CM | POA: Diagnosis not present

## 2020-01-29 DIAGNOSIS — D485 Neoplasm of uncertain behavior of skin: Secondary | ICD-10-CM | POA: Diagnosis not present

## 2020-01-29 DIAGNOSIS — B351 Tinea unguium: Secondary | ICD-10-CM | POA: Diagnosis not present

## 2020-02-03 ENCOUNTER — Other Ambulatory Visit: Payer: Self-pay | Admitting: Surgery

## 2020-02-03 DIAGNOSIS — Q069 Congenital malformation of spinal cord, unspecified: Secondary | ICD-10-CM | POA: Diagnosis not present

## 2020-02-03 DIAGNOSIS — D485 Neoplasm of uncertain behavior of skin: Secondary | ICD-10-CM | POA: Diagnosis not present

## 2020-02-03 DIAGNOSIS — L918 Other hypertrophic disorders of the skin: Secondary | ICD-10-CM | POA: Diagnosis not present

## 2020-02-03 DIAGNOSIS — D2271 Melanocytic nevi of right lower limb, including hip: Secondary | ICD-10-CM | POA: Diagnosis not present

## 2020-02-05 ENCOUNTER — Other Ambulatory Visit: Payer: Self-pay

## 2020-02-05 ENCOUNTER — Encounter
Admission: RE | Admit: 2020-02-05 | Discharge: 2020-02-05 | Disposition: A | Discharge status: Home / Self Care | Payer: PPO | Source: Ambulatory Visit | Attending: Surgery | Admitting: Surgery

## 2020-02-05 DIAGNOSIS — I10 Essential (primary) hypertension: Secondary | ICD-10-CM | POA: Insufficient documentation

## 2020-02-05 DIAGNOSIS — Z01818 Encounter for other preprocedural examination: Secondary | ICD-10-CM | POA: Insufficient documentation

## 2020-02-05 HISTORY — DX: Personal history of urinary calculi: Z87.442

## 2020-02-05 NOTE — Patient Instructions (Addendum)
Your procedure is scheduled on:12/08/2 Wednesday Report to the Registration Desk on the 1st floor of the [facility]. To find out your arrival time, please call [phone] between 1PM - 3PM on: 02/11/20- Tuesday  REMEMBER: Instructions that are not followed completely may result in serious medical risk, up to and including death; or upon the discretion of your surgeon and anesthesiologist your surgery may need to be rescheduled.  Do not eat food after midnight the night before surgery.  No gum chewing, lozengers or hard candies.  You may however, drink CLEAR liquids up to 2 hours before you are scheduled to arrive for your surgery. Do not drink anything within 2 hours of your scheduled arrival time.  Type 1 and Type 2 diabetics should only drink water.Marland Kitchen  TAKE THESE MEDICATIONS THE MORNING OF SURGERY WITH A SIP OF WATER:  - acetaminophen (TYLENOL) 325 MG tablet, may take 650 mg if needed - pantoprazole (PROTONIX) 40 MG tablet, take one the night before and one on the morning of surgery - helps to prevent nausea after surgery.)  Stop Metformin 2 days prior to surgery. Do Not take 12/06, 12/07, or the morning of surgery on 12/08.  Follow recommendations from Cardiologist, Pulmonologist or PCP regarding stopping Aspirin, Coumadin, Plavix, Eliquis, Pradaxa, or Pletal. Per patient, will stop today on 02/05/20.  One week prior to surgery: stop taking Excedrin Migraine12/01/21. Stop Anti-inflammatories (NSAIDS) such as Advil, Aleve, Ibuprofen, Motrin, Naproxen, Naprosyn and Aspirin based products such as Excedrin, Goodys Powder, BC Powder.  Stop ANY OVER THE COUNTER supplements until after surgery. (However, you may continue taking Vitamin D, Vitamin B, and multivitamin up until the day before surgery.)  No Alcohol for 24 hours before or after surgery.  No Smoking including e-cigarettes for 24 hours prior to surgery.  No chewable tobacco products for at least 6 hours prior to surgery.   No nicotine patches on the day of surgery.  Do not use any "recreational" drugs for at least a week prior to your surgery.  Please be advised that the combination of cocaine and anesthesia may have negative outcomes, up to and including death. If you test positive for cocaine, your surgery will be cancelled.  On the morning of surgery brush your teeth with toothpaste and water, you may rinse your mouth with mouthwash if you wish. Do not swallow any toothpaste or mouthwash.  Do not wear jewelry, make-up, hairpins, clips or nail polish.  Do not wear lotions, powders, or perfumes.   Do not shave body from the neck down 48 hours prior to surgery just in case you cut yourself which could leave a site for infection.  Also, freshly shaved skin may become irritated if using the CHG soap.  Contact lenses, hearing aids and dentures may not be worn into surgery.  Do not bring valuables to the hospital. [facility] is not responsible for any missing/lost belongings or valuables.   Use CHG Soap or wipes as directed on instruction sheet.  Notify your doctor if there is any change in your medical condition (cold, fever, infection).  Wear comfortable clothing (specific to your surgery type) to the hospital.  Plan for stool softeners for home use; pain medications have a tendency to cause constipation. You can also help prevent constipation by eating foods high in fiber such as fruits and vegetables and drinking plenty of fluids as your diet allows.  After surgery, you can help prevent lung complications by doing breathing exercises.  Take deep breaths and  cough every 1-2 hours. Your doctor may order a device called an Incentive Spirometer to help you take deep breaths. When coughing or sneezing, hold a pillow firmly against your incision with both hands. This is called "splinting." Doing this helps protect your incision. It also decreases belly discomfort.  If you are being admitted to the  hospital overnight, leave your suitcase in the car. After surgery it may be brought to your room.  If you are being discharged the day of surgery, you will not be allowed to drive home. You will need a responsible adult (18 years or older) to drive you home and stay with you that night.   If you are taking public transportation, you will need to have a responsible adult (18 years or older) with you. Please confirm with your physician that it is acceptable to use public transportation.   Please call the [place] Dept. at [phone] if you have any questions about these instructions.  Visitation Policy:  Patients undergoing a surgery or procedure may have one family member or support person with them as long as that person is not COVID-19 positive or experiencing its symptoms.  That person may remain in the waiting area during the procedure.  Inpatient Visitation Update:   In an effort to ensure the safety of our team members and our patients, we are implementing a change to our visitation policy:  Effective Monday, Aug. 9, at 7 a.m., inpatients will be allowed one support person.  o The support person may change daily.  o The support person must pass our screening, gel in and out, and wear a mask at all times, including in the patient's room.  o Patients must also wear a mask when staff or their support person are in the room.  o Masking is required regardless of vaccination status.  Systemwide, no visitors 17 or younger.

## 2020-02-05 NOTE — Progress Notes (Signed)
  [facility] Perioperative Services: Pre-Admission/Anesthesia Testing   Date: 02/05/20 Name: [name] MRN:   [id number]  Re: Consideration of preoperative prophylactic antibiotic change   Request sent to: Poggi, Marshall Cork, MD (routed and/or faxed via [facility])  Planned Surgical Procedure(s):    Case: [id number] Date/Time: 02/12/20 1339   Procedure: ARTHROSCOPIC DEBRIDMENT OF RIGHT KNEE. (Right Knee)   Anesthesia type: Choice   Pre-op diagnosis: Chondromalacia of right knee M94.261   Location: [facility] OR ROOM 02 / [place] ORS FOR ANESTHESIA GROUP   Surgeons: Corky Mull, MD    Notes: 1. Patient has NO documented allergy to PCN   2. Screened as appropriate for cephalosporin use during medication reconciliation . No immediate angioedema, dysphagia, SOB, anaphylaxis symptoms. . No severe rash involving mucous membranes or skin necrosis. . No hospital admissions related to side effects of PCN/cephalosporin use.  . No documented reaction to PCN or cephalosporin in the last 10 years.  Request:  As an evidence based approach to reducing the rate of incidence for post-operative SSI and the development of MDROs, could an agent with narrower coverage for preoperative prophylaxis in this patient's upcoming surgical course be considered?  1. Currently ordered preoperative prophylactic ABX: vancomycin.   2. Specifically requesting change to cephalosporin (CEFAZOLIN).   3. Please communicate decision with me and I will change the orders in Epic as per your direction.   Honor Loh, MSN, APRN, FNP-C, CEN [facility]  Peri-operative Services Nurse Practitioner 02/05/20 4:56 PM

## 2020-02-07 NOTE — Progress Notes (Signed)
  [facility] Perioperative Services: Pre-Admission/Anesthesia Testing     Date: 02/07/20  Name: Ronald Trujillo MRN:   [id number]  Re: Change in [place] for upcoming surgery   Case: [id number] Date/Time: 02/12/20 1339   Procedure: ARTHROSCOPIC DEBRIDMENT OF RIGHT KNEE. (Right Knee)   Anesthesia type: Choice   Pre-op diagnosis: Chondromalacia of right knee M94.261   Location: [facility] OR ROOM 02 / [place] ORS FOR ANESTHESIA GROUP   Surgeons: Corky Mull, MD     Primary attending surgeon was consulted regarding consideration of therapeutic change in antimicrobial agent being used for preoperative prophylaxis in this patient's upcoming surgical case. Following analysis of the risk versus benefits, Dr. Roland Rack, Marshall Cork, MD advising that it would be acceptable to discontinue the ordered vancomycin and place an order for cefazolin 2 gm IV on call to the OR. Orders for this patient were amended by me following collaborative conversation with attending surgeon.  Honor Loh, MSN, APRN, FNP-C, CEN [facility]  Peri-operative Services Nurse Practitioner Phone: [phone] 02/07/20 1:12 PM

## 2020-02-10 ENCOUNTER — Other Ambulatory Visit: Payer: Self-pay

## 2020-02-10 ENCOUNTER — Encounter
Admission: RE | Admit: 2020-02-10 | Discharge: 2020-02-10 | Disposition: A | Discharge status: Home / Self Care | Payer: PPO | Source: Ambulatory Visit | Attending: Surgery | Admitting: Surgery

## 2020-02-10 DIAGNOSIS — Z20822 Contact with and (suspected) exposure to covid-19: Secondary | ICD-10-CM | POA: Insufficient documentation

## 2020-02-10 DIAGNOSIS — I10 Essential (primary) hypertension: Secondary | ICD-10-CM | POA: Diagnosis not present

## 2020-02-10 DIAGNOSIS — Z01818 Encounter for other preprocedural examination: Secondary | ICD-10-CM | POA: Insufficient documentation

## 2020-02-10 LAB — BASIC METABOLIC PANEL
Anion gap: 9 (ref 5–15)
BUN: 17 mg/dL (ref 8–23)
CO2: 25 mmol/L (ref 22–32)
Calcium: 9.1 mg/dL (ref 8.9–10.3)
Chloride: 105 mmol/L (ref 98–111)
Creatinine, Ser: 0.93 mg/dL (ref 0.61–1.24)
GFR, Estimated: >60 mL/min (ref >60)
Glucose, Bld: 97 mg/dL (ref 70–99)
Potassium: 3.8 mmol/L (ref 3.5–5.1)
Sodium: 139 mmol/L (ref 135–145)

## 2020-02-10 LAB — CBC
HCT: 36.7 % — ABNORMAL LOW (ref 39.0–52.0)
Hemoglobin: 12.1 g/dL — ABNORMAL LOW (ref 13.0–17.0)
MCH: 27.8 pg (ref 26.0–34.0)
MCHC: 33 g/dL (ref 30.0–36.0)
MCV: 84.4 fL (ref 80.0–100.0)
Platelets: 258 10*3/uL (ref 150–400)
RBC: 4.35 MIL/uL (ref 4.22–5.81)
RDW: 14.3 % (ref 11.5–15.5)
WBC: 7 10*3/uL (ref 4.0–10.5)
nRBC: 0 % (ref 0.0–0.2)

## 2020-02-11 LAB — SARS CORONAVIRUS 2 (TAT 6-24 HRS): SARS Coronavirus 2: NEGATIVE

## 2020-02-12 ENCOUNTER — Encounter
Admission: RE | Disposition: A | Discharge status: Home / Self Care | Payer: Self-pay | Source: Home / Self Care | Attending: Surgery

## 2020-02-12 ENCOUNTER — Encounter: Payer: Self-pay | Admitting: Surgery

## 2020-02-12 ENCOUNTER — Ambulatory Visit
Admission: RE | Admit: 2020-02-12 | Discharge: 2020-02-12 | Disposition: A | Discharge status: Home / Self Care | Payer: PPO | Attending: Surgery | Admitting: Surgery

## 2020-02-12 ENCOUNTER — Ambulatory Visit: Payer: PPO | Admitting: Urgent Care

## 2020-02-12 ENCOUNTER — Other Ambulatory Visit: Payer: Self-pay

## 2020-02-12 DIAGNOSIS — Z79899 Other long term (current) drug therapy: Secondary | ICD-10-CM | POA: Insufficient documentation

## 2020-02-12 DIAGNOSIS — I10 Essential (primary) hypertension: Secondary | ICD-10-CM | POA: Diagnosis not present

## 2020-02-12 DIAGNOSIS — Z8673 Personal history of transient ischemic attack (TIA), and cerebral infarction without residual deficits: Secondary | ICD-10-CM | POA: Diagnosis not present

## 2020-02-12 DIAGNOSIS — M6751 Plica syndrome, right knee: Secondary | ICD-10-CM | POA: Insufficient documentation

## 2020-02-12 DIAGNOSIS — M94261 Chondromalacia, right knee: Secondary | ICD-10-CM | POA: Diagnosis not present

## 2020-02-12 DIAGNOSIS — E119 Type 2 diabetes mellitus without complications: Secondary | ICD-10-CM | POA: Diagnosis not present

## 2020-02-12 DIAGNOSIS — Z7984 Long term (current) use of oral hypoglycemic drugs: Secondary | ICD-10-CM | POA: Insufficient documentation

## 2020-02-12 DIAGNOSIS — Z7902 Long term (current) use of antithrombotics/antiplatelets: Secondary | ICD-10-CM | POA: Diagnosis not present

## 2020-02-12 DIAGNOSIS — E785 Hyperlipidemia, unspecified: Secondary | ICD-10-CM | POA: Diagnosis not present

## 2020-02-12 HISTORY — PX: KNEE ARTHROSCOPY: SHX127

## 2020-02-12 HISTORY — PX: CHONDROPLASTY: SHX5177

## 2020-02-12 LAB — GLUCOSE, CAPILLARY
Glucose-Capillary: 93 mg/dL (ref 70–99)
Glucose-Capillary: 109 mg/dL — ABNORMAL HIGH (ref 70–99)

## 2020-02-12 SURGERY — ARTHROSCOPY, KNEE
Anesthesia: General | Site: Knee | Laterality: Right

## 2020-02-12 MED ORDER — PROPOFOL 10 MG/ML IV BOLUS
INTRAVENOUS | Status: DC | PRN
  Administered 2020-02-12: 50 mg via INTRAVENOUS
  Administered 2020-02-12: 150 mg via INTRAVENOUS

## 2020-02-12 MED ORDER — METOCLOPRAMIDE HCL 5 MG/ML IJ SOLN: 5.0000 mg | Freq: Three times a day (TID) | INTRAMUSCULAR | Status: DC | PRN

## 2020-02-12 MED ORDER — ONDANSETRON HCL 4 MG/2ML IJ SOLN
INTRAMUSCULAR | Status: DC | PRN
  Administered 2020-02-12: 4 mg via INTRAVENOUS

## 2020-02-12 MED ORDER — BUPIVACAINE-EPINEPHRINE (PF) 0.5% -1:200000 IJ SOLN
INTRAMUSCULAR | Status: DC | PRN
  Administered 2020-02-12: 20 mL
  Administered 2020-02-12: 30 mL

## 2020-02-12 MED ORDER — CEFAZOLIN SODIUM-DEXTROSE 2-4 GM/100ML-% IV SOLN
2.0000 g | Freq: Once | INTRAVENOUS | Status: AC
  Administered 2020-02-12: 2 g via INTRAVENOUS

## 2020-02-12 MED ORDER — DEXAMETHASONE SODIUM PHOSPHATE 10 MG/ML IJ SOLN
INTRAMUSCULAR | Status: DC | PRN
  Administered 2020-02-12: 10 mg via INTRAVENOUS

## 2020-02-12 MED ORDER — TRAMADOL HCL 50 MG PO TABS
50.0000 mg | ORAL_TABLET | Freq: Four times a day (QID) | ORAL | Status: DC | PRN
  Administered 2020-02-12: 50 mg via ORAL

## 2020-02-12 MED ORDER — ONDANSETRON HCL 4 MG/2ML IJ SOLN: 4.0000 mg | Freq: Once | INTRAMUSCULAR | Status: DC | PRN

## 2020-02-12 MED ORDER — CHLORHEXIDINE GLUCONATE 0.12 % MT SOLN
OROMUCOSAL | Status: AC
  Administered 2020-02-12: 15 mL
  Filled 2020-02-12: qty 15

## 2020-02-12 MED ORDER — MIDAZOLAM HCL 2 MG/2ML IJ SOLN
INTRAMUSCULAR | Status: DC | PRN
  Administered 2020-02-12: 2 mg via INTRAVENOUS

## 2020-02-12 MED ORDER — FENTANYL CITRATE (PF) 100 MCG/2ML IJ SOLN: 25.0000 ug | INTRAMUSCULAR | Status: DC | PRN

## 2020-02-12 MED ORDER — ONDANSETRON HCL 4 MG/2ML IJ SOLN
INTRAMUSCULAR | Status: AC
  Filled 2020-02-12: qty 2

## 2020-02-12 MED ORDER — FENTANYL CITRATE (PF) 100 MCG/2ML IJ SOLN
INTRAMUSCULAR | Status: DC | PRN
  Administered 2020-02-12: 100 ug via INTRAVENOUS

## 2020-02-12 MED ORDER — LIDOCAINE HCL (CARDIAC) PF 100 MG/5ML IV SOSY
PREFILLED_SYRINGE | INTRAVENOUS | Status: DC | PRN
  Administered 2020-02-12: 100 mg via INTRAVENOUS

## 2020-02-12 MED ORDER — SODIUM CHLORIDE 0.9 % IV SOLN: INTRAVENOUS | Status: DC

## 2020-02-12 MED ORDER — LIDOCAINE HCL 1 % IJ SOLN
INTRAMUSCULAR | Status: DC | PRN
  Administered 2020-02-12: 30 mL

## 2020-02-12 MED ORDER — TRAMADOL HCL 50 MG PO TABS
ORAL_TABLET | ORAL | Status: AC
  Filled 2020-02-12: qty 1

## 2020-02-12 MED ORDER — KETOROLAC TROMETHAMINE 30 MG/ML IJ SOLN
INTRAMUSCULAR | Status: DC | PRN
  Administered 2020-02-12: 30 mg via INTRAVENOUS

## 2020-02-12 MED ORDER — MIDAZOLAM HCL 2 MG/2ML IJ SOLN
INTRAMUSCULAR | Status: AC
  Filled 2020-02-12: qty 2

## 2020-02-12 MED ORDER — METOCLOPRAMIDE HCL 10 MG PO TABS: 5.0000 mg | ORAL_TABLET | Freq: Three times a day (TID) | ORAL | Status: DC | PRN

## 2020-02-12 MED ORDER — PROPOFOL 10 MG/ML IV BOLUS
INTRAVENOUS | Status: AC
  Filled 2020-02-12: qty 20

## 2020-02-12 MED ORDER — EPHEDRINE SULFATE 50 MG/ML IJ SOLN
INTRAMUSCULAR | Status: DC | PRN
  Administered 2020-02-12: 25 mg via INTRAVENOUS

## 2020-02-12 MED ORDER — KETOROLAC TROMETHAMINE 30 MG/ML IJ SOLN
INTRAMUSCULAR | Status: AC
  Filled 2020-02-12: qty 1

## 2020-02-12 MED ORDER — ONDANSETRON HCL 4 MG PO TABS: 4.0000 mg | ORAL_TABLET | Freq: Four times a day (QID) | ORAL | Status: DC | PRN

## 2020-02-12 MED ORDER — FENTANYL CITRATE (PF) 100 MCG/2ML IJ SOLN
INTRAMUSCULAR | Status: AC
  Filled 2020-02-12: qty 2

## 2020-02-12 MED ORDER — DEXAMETHASONE SODIUM PHOSPHATE 10 MG/ML IJ SOLN
INTRAMUSCULAR | Status: AC
  Filled 2020-02-12: qty 1

## 2020-02-12 MED ORDER — SUCCINYLCHOLINE CHLORIDE 20 MG/ML IJ SOLN
INTRAMUSCULAR | Status: DC | PRN
  Administered 2020-02-12: 100 mg via INTRAVENOUS

## 2020-02-12 MED ORDER — ONDANSETRON HCL 4 MG/2ML IJ SOLN: 4.0000 mg | Freq: Four times a day (QID) | INTRAMUSCULAR | Status: DC | PRN

## 2020-02-12 MED ORDER — CEFAZOLIN SODIUM-DEXTROSE 2-4 GM/100ML-% IV SOLN
INTRAVENOUS | Status: AC
  Filled 2020-02-12: qty 100

## 2020-02-12 MED ORDER — ACETAMINOPHEN 10 MG/ML IV SOLN
INTRAVENOUS | Status: DC | PRN
  Administered 2020-02-12: 1000 mg via INTRAVENOUS

## 2020-02-12 MED ORDER — LIDOCAINE HCL (PF) 2 % IJ SOLN
INTRAMUSCULAR | Status: AC
  Filled 2020-02-12: qty 5

## 2020-02-12 SURGICAL SUPPLY — 37 items
APL PRP STRL LF DISP 70% ISPRP (MISCELLANEOUS) ×1
BAG COUNTER SPONGE EZ (MISCELLANEOUS) IMPLANT
BAG SPNG 4X4 CLR HAZ (MISCELLANEOUS)
BLADE FULL RADIUS 3.5 (BLADE) ×3 IMPLANT
BLADE SHAVER 4.5X7 STR FR (MISCELLANEOUS) ×3 IMPLANT
BNDG ELASTIC 6X5.8 VLCR STR LF (GAUZE/BANDAGES/DRESSINGS) ×3 IMPLANT
CHLORAPREP W/TINT 26 (MISCELLANEOUS) ×3 IMPLANT
COUNTER SPONGE BAG EZ (MISCELLANEOUS)
COVER WAND RF STERILE (DRAPES) ×3 IMPLANT
CUFF TOURN SGL QUICK 24 (TOURNIQUET CUFF)
CUFF TOURN SGL QUICK 30 (TOURNIQUET CUFF)
CUFF TRNQT CYL 24X4X16.5-23 (TOURNIQUET CUFF) IMPLANT
CUFF TRNQT CYL 30X4X21-28X (TOURNIQUET CUFF) IMPLANT
ELECT REM PT RETURN 9FT ADLT (ELECTROSURGICAL) ×3
ELECTRODE REM PT RTRN 9FT ADLT (ELECTROSURGICAL) ×1 IMPLANT
GAUZE SPONGE 4X4 12PLY STRL (GAUZE/BANDAGES/DRESSINGS) ×3 IMPLANT
GLOVE BIO SURGEON STRL SZ8 (GLOVE) ×6 IMPLANT
GLOVE BIOGEL M 7.0 STRL (GLOVE) ×6 IMPLANT
GLOVE BIOGEL PI IND STRL 7.5 (GLOVE) ×1 IMPLANT
GLOVE BIOGEL PI INDICATOR 7.5 (GLOVE) ×2
GLOVE INDICATOR 8.0 STRL GRN (GLOVE) ×3 IMPLANT
GOWN STRL REUS W/ TWL LRG LVL3 (GOWN DISPOSABLE) ×1 IMPLANT
GOWN STRL REUS W/ TWL XL LVL3 (GOWN DISPOSABLE) ×2 IMPLANT
GOWN STRL REUS W/TWL LRG LVL3 (GOWN DISPOSABLE) ×3
GOWN STRL REUS W/TWL XL LVL3 (GOWN DISPOSABLE) ×6
IV LACTATED RINGER IRRG 3000ML (IV SOLUTION) ×3
IV LR IRRIG 3000ML ARTHROMATIC (IV SOLUTION) ×1 IMPLANT
KIT TURNOVER KIT A (KITS) ×3 IMPLANT
MANIFOLD NEPTUNE II (INSTRUMENTS) ×6 IMPLANT
NEEDLE HYPO 21X1.5 SAFETY (NEEDLE) ×3 IMPLANT
PACK ARTHROSCOPY KNEE (MISCELLANEOUS) ×3 IMPLANT
PENCIL ELECTRO HAND CTR (MISCELLANEOUS) ×3 IMPLANT
SUT PROLENE 4 0 PS 2 18 (SUTURE) ×3 IMPLANT
SUT TICRON COATED BLUE 2 0 30 (SUTURE) IMPLANT
SYR 50ML LL SCALE MARK (SYRINGE) ×3 IMPLANT
TUBING ARTHRO INFLOW-ONLY STRL (TUBING) ×3 IMPLANT
WAND WEREWOLF FLOW 90D (MISCELLANEOUS) ×3 IMPLANT

## 2020-02-12 NOTE — Transfer of Care (Signed)
Immediate Anesthesia Transfer of Care Note  Patient: Ronald Trujillo  Procedure(s) Performed: ARTHROSCOPIC DEBRIDMENT OF RIGHT KNEE. (Right Knee) CHONDROPLASTY (Right Knee)  Patient Location: PACU  Anesthesia Type:General  Level of Consciousness: sedated  Airway & Oxygen Therapy: Patient Spontanous Breathing and Patient connected to face mask oxygen  Post-op Assessment: Report given to RN and Post -op Vital signs reviewed and stable  Post vital signs: Reviewed and stable  Last Vitals:  Vitals Value Taken Time  BP 145/86 02/12/20 1606  Temp    Pulse 109 02/12/20 1608  Resp 13 02/12/20 1608  SpO2 100 % 02/12/20 1608  Vitals shown include unvalidated device data.  Last Pain:  Vitals:   02/12/20 1310  TempSrc: Temporal  PainSc: 8       Patients Stated Pain Goal: 2 (13/88/71 9597)  Complications: No complications documented.
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
## 2020-02-12 NOTE — Discharge Instructions (Addendum)
Orthopedic discharge instructions: Keep dressing dry and intact.  May shower after dressing changed on post-op day #4 (Sunday).  Cover sutures with Band-Aids after drying off. Apply ice frequently to knee. Take ibuprofen 600-800 mg TID with meals for 5-7 days, then as necessary. (next dose available at 11:30 pm tonight 02/12/20) Take pain medication as prescribed or ES Tylenol when needed. (next dose of tylenol available at 11:30 pm tonight 02/12/20) May weight-bear as tolerated - use crutches or walker as needed. Follow-up in 10-14 days or as scheduled. (TID = three times per day (every 8 hours) Per recovery room, MD called in pain med to your pharmacy.  You had tramadol at the hospital at 4:23 pm   AMBULATORY SURGERY  DISCHARGE INSTRUCTIONS   1) The drugs that you were given will stay in your system until tomorrow so for the next 24 hours you should not:  A) Drive an automobile B) Make any legal decisions C) Drink any alcoholic beverage   2) You may resume regular meals tomorrow.  Today it is better to start with liquids and gradually work up to solid foods.  You may eat anything you prefer, but it is better to start with liquids, then soup and crackers, and gradually work up to solid foods.   3) Please notify your doctor immediately if you have any unusual bleeding, trouble breathing, redness and pain at the surgery site, drainage, fever, or pain not relieved by medication.  4) Your post-operative visit with Dr.                                     is: Date:                        Time:    Please call to schedule your post-operative visit.  5) Additional Instructions:  Knee Arthroscopy, Care After This sheet gives you information about how to care for yourself after your procedure. Your health care provider may also give you more specific instructions. If you have problems or questions, contact your health care provider. What can I expect after the procedure? After the  procedure, it is common to have:  Soreness.  Swelling.  Pain that can be relieved by taking pain medicine. Follow these instructions at home: Incision care   Follow instructions from your health care provider about how to take care of your incisions. Make sure you: ? Wash your hands with soap and water before you change your bandage (dressing). If soap and water are not available, use hand sanitizer. ? Change your dressing as told by your health care provider. ? Leave stitches (sutures), staples, skin glue, or adhesive strips in place. These skin closures may need to stay in place for 2 weeks or longer. If adhesive strip edges start to loosen and curl up, you may trim the loose edges. Do not remove adhesive strips completely unless your health care provider tells you to do that.  Check your incision areas every day for signs of infection. Check for: ? Redness. ? More swelling or pain. ? Fluid or blood. ? Warmth. ? Pus or a bad smell. Bathing  Do not take baths, swim, or use a hot tub until your health care provider approves. Ask your health care provider if you may take showers. You may only be allowed to take sponge baths. Activity  Do not use your knee  to support your body weight until your health care provider says that you can. Follow weight-bearing restrictions as told. Use crutches or other devices to help you move around (assistive devices) as directed.  Ask your health care provider what activities are safe for you during recovery, and what activities you need to avoid.  If physical therapy was prescribed, do exercises as directed. Doing exercises may help improve knee movement and flexibility (range of motion).  Do not lift anything that is heavier than 10 lb (4.5 kg), or the limit that you are told, until your health care provider says that it is safe. Driving  Do not drive until your health care provider approves. You may be able to drive after 1-3 weeks.  Do not  drive or use heavy machinery while taking prescription pain medicine. Managing pain, stiffness, and swelling   If directed, put ice on the injured area: ? Put ice in a plastic bag or use the icing device (cold therapy unit) that you were given. Follow instructions from your health care provider about how to use the icing device. ? Place a towel between your skin and the bag or between your skin and the icing device. ? Leave the ice on for 20 minutes, 2-3 times a day.  Move your toes often to avoid stiffness and to lessen swelling.  Raise (elevate) the injured area above the level of your heart while you are sitting or lying down. If you are taking blood thinners:  Before you take any medicines that contain aspirin or NSAIDs, talk with your health care provider. These medicines increase your risk for dangerous bleeding.  Take your medicine exactly as told, at the same time every day.  Avoid activities that could cause injury or bruising, and follow instructions about how to prevent falls.  Wear a medical alert bracelet or carry a card that lists what medicines you take. General instructions  Take over-the-counter and prescription medicines only as told by your health care provider.  If you are taking prescription pain medicine, take actions to prevent or treat constipation. Your health care provider may recommend that you: ? Drink enough fluid to keep your urine pale yellow. ? Eat foods that are high in fiber, such as fresh fruits and vegetables, whole grains, and beans. ? Limit foods that are high in fat and processed sugars, such as fried or sweet foods. ? Take an over-the-counter or prescription medicines for constipation.  Do not use any products that contain nicotine or tobacco, such as cigarettes and e-cigarettes. These can delay incision or bone healing. If you need help quitting, ask your health care provider.  Wear compression stockings as told by your health care provider.  These stockings help to prevent blood clots and reduce swelling in your legs.  Keep all follow-up visits as told by your health care provider. This is important. Contact a health care provider if you:  Have a fever.  Have severe pain.  Have redness around an incision.  Have more swelling.  Have fluid or blood coming from an incision.  Notice that an incision feels warm to the touch.  Notice pus or a bad smell coming from an incision.  Notice that an incision opens up.  Develop a rash. Get help right away if you:  Have difficulty breathing.  Have shortness of breath.  Have chest pain.  Develop pain in your lower leg or at the back of your knee.  Have numbness or tingling in your lower leg  or your foot. Summary  Raise (elevate) the injured area above the level of your heart while you are sitting or lying down.  To help relieve pain and swelling, put ice on your leg for 20 minutes at a time, 2-3 times a day.  If you were prescribed a blood thinner, avoid activities that could cause injury or bruising, and follow instructions about how to prevent falls.  If physical therapy was prescribed, do exercises as directed. Doing exercises may help improve range of motion. This information is not intended to replace advice given to you by your health care provider. Make sure you discuss any questions you have with your health care provider. Document Revised: 02/03/2017 Document Reviewed: 01/04/2017 Elsevier Patient Education  2020 [employer].

## 2020-02-12 NOTE — Op Note (Signed)
02/12/2020  4:03 PM  Patient:   Ronald Trujillo  Pre-Op Diagnosis:   Chondromalacia of right knee.  Postoperative diagnosis:   Chondromalacia of femoral trochlea with symptomatic plica, right knee.  Procedure:   Arthroscopic debridement of symptomatic plica and abrasion chondroplasty of grade 3 chondromalacial changes of femoral trochlea, right knee.  Surgeon:   Pascal Lux, MD  Anesthesia:   GET  Findings:   As above. There also was a focal area of grade 2 chondromalacial changes involving the lateral edge of the weightbearing portion of the medial femoral condyle. The remaining articular surfaces all were in satisfactory condition. The medial and lateral menisci were intact, as were the anterior and posterior cruciate ligaments.  Complications:   None  EBL:   5 cc.  Total fluids:   1000 cc of crystalloid.  Tourniquet time:   None  Drains:   None  Closure:   4-0 Prolene interrupted sutures.  Brief clinical note:   The patient is a 59 year old male with a long history of anterior right knee pain. His symptoms have persisted despite medications, activity modification, steroid injections, etc. His history and examination are consistent with chondromalacia, primarily involving the patellofemoral compartment of the right knee. The patient presents at this time for an arthroscopic debridement of the right knee.  Procedure:   The patient was brought into the operating room and lain in the supine position. After an attempt at general laryngeal mask anesthesia was performed, the patient underwent general endotracheal intubation and anesthesia.  At this point, a timeout was performed to verify the appropriate side. The patient's right knee was injected sterilely using a solution of 30 cc of 1% lidocaine and 30 cc of 0.5% Sensorcaine with epinephrine. The right lower extremity was prepped with ChloraPrep solution before being draped sterilely. Preoperative antibiotics were administered.  The expected portal sites were injected with 0.5% Sensorcaine with epinephrine before the camera was placed in the anterolateral portal and instrumentation performed through the anteromedial portal.   The knee was sequentially examined beginning in the suprapatellar pouch, then progressing to the patellofemoral space, the medial gutter and compartment, the notch, and finally the lateral compartment and gutter. The findings were as described above. Abundant reactive synovial tissues anteriorly were debrided using the full-radius resector in order to improve visualization. This debridement revealed a thickened medial plica which appeared to be abrading the medial edge of the medial femoral condyle. This thickened plica was debrided back to stable margins using the full-radius resector. Areas of grade III chondromalacia involving the femoral trochlea also were debrided back to stable margins using the full-radius resector before the margins were lightly "annealed" using the ArthroCare wand set at the lowest setting. The instruments were removed from the joint after suctioning the excess fluid.   The portal sites were closed using 4-0 Prolene interrupted sutures before a sterile bulky dressing was applied to the knee. The patient was then awakened, extubated, and returned to the recovery room in satisfactory condition after tolerating the procedure well.

## 2020-02-12 NOTE — Anesthesia Preprocedure Evaluation (Signed)
Anesthesia Evaluation  Patient identified by MRN, date of birth, ID band Patient awake    Reviewed: Allergy & Precautions, H&P , NPO status   Airway Mallampati: II  TM Distance: >3 FB Neck ROM: full    Dental   Pulmonary neg pulmonary ROS,    Pulmonary exam normal        Cardiovascular hypertension, Normal cardiovascular exam     Neuro/Psych  Headaches, TIACVA negative psych ROS   GI/Hepatic negative GI ROS, Neg liver ROS,   Endo/Other  diabetes  Renal/GU negative Renal ROS  negative genitourinary   Musculoskeletal  (+) Arthritis , Osteoarthritis,    Abdominal   Peds negative pediatric ROS (+)  Hematology  (+) anemia ,   Anesthesia Other Findings   Reproductive/Obstetrics                             Anesthesia Physical  Anesthesia Plan  ASA: III  Anesthesia Plan: General   Post-op Pain Management:    Induction:   PONV Risk Score and Plan:   Airway Management Planned: Oral ETT and LMA  Additional Equipment:   Intra-op Plan:   Post-operative Plan:   Informed Consent: I have reviewed the patients History and Physical, chart, labs and discussed the procedure including the risks, benefits and alternatives for the proposed anesthesia with the patient or authorized representative who has indicated his/her understanding and acceptance.     Dental advisory given  Plan Discussed with: CRNA  Anesthesia Plan Comments:         Anesthesia Quick Evaluation

## 2020-02-12 NOTE — Anesthesia Procedure Notes (Signed)
Procedure Name: Intubation Performed by: Gaynelle Cage, CRNA Pre-anesthesia Checklist: Patient identified, Emergency Drugs available, Suction available and Patient being monitored Patient Re-evaluated:Patient Re-evaluated prior to induction Oxygen Delivery Method: Circle system utilized Preoxygenation: Pre-oxygenation with 100% oxygen Induction Type: IV induction LMA: LMA inserted LMA Size: 4.0 Laryngoscope Size: McGraph and 3 Tube type: Oral Tube size: 7.0 mm Number of attempts: 1 Airway Equipment and Method: Stylet Placement Confirmation: ETT inserted through vocal cords under direct vision,  positive ETCO2 and breath sounds checked- equal and bilateral Secured at: 22 cm Tube secured with: Tape Dental Injury: Teeth and Oropharynx as per pre-operative assessment  Difficulty Due To: Difficulty was unanticipated Comments: Patient was prepared for LMA insertion but device would seat adequately.  Device was removed & patient was intubated with ETT without apparent sequelae

## 2020-02-13 ENCOUNTER — Encounter: Payer: Self-pay | Admitting: Surgery

## 2020-02-14 NOTE — Anesthesia Postprocedure Evaluation (Signed)
Anesthesia Post Note  Patient: Ronald Trujillo  Procedure(s) Performed: ARTHROSCOPIC DEBRIDMENT OF RIGHT KNEE. (Right Knee) CHONDROPLASTY (Right Knee)  Patient location during evaluation: PACU Anesthesia Type: General Level of consciousness: awake and alert and oriented Pain management: pain level controlled Vital Signs Assessment: post-procedure vital signs reviewed and stable Respiratory status: spontaneous breathing Cardiovascular status: blood pressure returned to baseline Anesthetic complications: no   No complications documented.   Last Vitals:  Vitals:   02/12/20 1644 02/12/20 1702  BP: (!) 167/87 (!) 160/82  Pulse: 97 (!) 106  Resp: 16 18  Temp: 36.6 C   SpO2: 98% 99%    Last Pain:  Vitals:   02/13/20 0856  TempSrc:   PainSc: 8                  Allysen Lazo

## 2020-02-20 DIAGNOSIS — R21 Rash and other nonspecific skin eruption: Secondary | ICD-10-CM | POA: Diagnosis not present

## 2020-02-20 DIAGNOSIS — R519 Headache, unspecified: Secondary | ICD-10-CM | POA: Diagnosis not present

## 2020-02-20 DIAGNOSIS — R760 Raised antibody titer: Secondary | ICD-10-CM | POA: Diagnosis not present

## 2020-02-24 DIAGNOSIS — E538 Deficiency of other specified B group vitamins: Secondary | ICD-10-CM | POA: Diagnosis not present

## 2020-02-24 DIAGNOSIS — R809 Proteinuria, unspecified: Secondary | ICD-10-CM | POA: Diagnosis not present

## 2020-02-24 DIAGNOSIS — E7849 Other hyperlipidemia: Secondary | ICD-10-CM | POA: Diagnosis not present

## 2020-02-24 DIAGNOSIS — E1129 Type 2 diabetes mellitus with other diabetic kidney complication: Secondary | ICD-10-CM | POA: Diagnosis not present

## 2020-02-27 DIAGNOSIS — Z8673 Personal history of transient ischemic attack (TIA), and cerebral infarction without residual deficits: Secondary | ICD-10-CM | POA: Diagnosis not present

## 2020-02-27 DIAGNOSIS — G43711 Chronic migraine without aura, intractable, with status migrainosus: Secondary | ICD-10-CM | POA: Diagnosis not present

## 2020-03-02 DIAGNOSIS — E1129 Type 2 diabetes mellitus with other diabetic kidney complication: Secondary | ICD-10-CM | POA: Diagnosis not present

## 2020-03-02 DIAGNOSIS — D649 Anemia, unspecified: Secondary | ICD-10-CM | POA: Diagnosis not present

## 2020-03-02 DIAGNOSIS — E538 Deficiency of other specified B group vitamins: Secondary | ICD-10-CM | POA: Diagnosis not present

## 2020-03-02 DIAGNOSIS — I10 Essential (primary) hypertension: Secondary | ICD-10-CM | POA: Diagnosis not present

## 2020-03-02 DIAGNOSIS — E7849 Other hyperlipidemia: Secondary | ICD-10-CM | POA: Diagnosis not present

## 2020-03-02 DIAGNOSIS — R809 Proteinuria, unspecified: Secondary | ICD-10-CM | POA: Diagnosis not present

## 2020-03-02 DIAGNOSIS — Z0000 Encounter for general adult medical examination without abnormal findings: Secondary | ICD-10-CM | POA: Diagnosis not present

## 2020-03-02 DIAGNOSIS — D72829 Elevated white blood cell count, unspecified: Secondary | ICD-10-CM | POA: Diagnosis not present

## 2020-04-29 DIAGNOSIS — Z7984 Long term (current) use of oral hypoglycemic drugs: Secondary | ICD-10-CM | POA: Diagnosis not present

## 2020-04-29 DIAGNOSIS — E119 Type 2 diabetes mellitus without complications: Secondary | ICD-10-CM | POA: Diagnosis not present

## 2020-04-29 DIAGNOSIS — I10 Essential (primary) hypertension: Secondary | ICD-10-CM | POA: Diagnosis not present

## 2020-04-29 DIAGNOSIS — K449 Diaphragmatic hernia without obstruction or gangrene: Secondary | ICD-10-CM | POA: Diagnosis not present

## 2020-04-29 DIAGNOSIS — Z79899 Other long term (current) drug therapy: Secondary | ICD-10-CM | POA: Diagnosis not present

## 2020-04-29 DIAGNOSIS — K2270 Barrett's esophagus without dysplasia: Secondary | ICD-10-CM | POA: Diagnosis not present

## 2020-04-29 DIAGNOSIS — E785 Hyperlipidemia, unspecified: Secondary | ICD-10-CM | POA: Diagnosis not present

## 2020-04-29 DIAGNOSIS — D649 Anemia, unspecified: Secondary | ICD-10-CM | POA: Diagnosis not present

## 2020-04-29 DIAGNOSIS — K22710 Barrett's esophagus with low grade dysplasia: Secondary | ICD-10-CM | POA: Diagnosis not present

## 2020-05-11 DIAGNOSIS — K2270 Barrett's esophagus without dysplasia: Secondary | ICD-10-CM | POA: Diagnosis not present

## 2020-05-11 DIAGNOSIS — K219 Gastro-esophageal reflux disease without esophagitis: Secondary | ICD-10-CM | POA: Diagnosis not present

## 2020-05-12 DIAGNOSIS — G43711 Chronic migraine without aura, intractable, with status migrainosus: Secondary | ICD-10-CM | POA: Diagnosis not present

## 2020-05-12 DIAGNOSIS — Z8673 Personal history of transient ischemic attack (TIA), and cerebral infarction without residual deficits: Secondary | ICD-10-CM | POA: Diagnosis not present

## 2020-05-28 DIAGNOSIS — L570 Actinic keratosis: Secondary | ICD-10-CM | POA: Diagnosis not present

## 2020-05-29 DIAGNOSIS — E1142 Type 2 diabetes mellitus with diabetic polyneuropathy: Secondary | ICD-10-CM | POA: Diagnosis not present

## 2020-05-29 DIAGNOSIS — B351 Tinea unguium: Secondary | ICD-10-CM | POA: Diagnosis not present

## 2020-06-18 DIAGNOSIS — R221 Localized swelling, mass and lump, neck: Secondary | ICD-10-CM | POA: Diagnosis not present

## 2020-08-24 DIAGNOSIS — D72829 Elevated white blood cell count, unspecified: Secondary | ICD-10-CM | POA: Diagnosis not present

## 2020-08-24 DIAGNOSIS — E1129 Type 2 diabetes mellitus with other diabetic kidney complication: Secondary | ICD-10-CM | POA: Diagnosis not present

## 2020-08-24 DIAGNOSIS — E538 Deficiency of other specified B group vitamins: Secondary | ICD-10-CM | POA: Diagnosis not present

## 2020-08-24 DIAGNOSIS — R809 Proteinuria, unspecified: Secondary | ICD-10-CM | POA: Diagnosis not present

## 2020-08-24 DIAGNOSIS — E7849 Other hyperlipidemia: Secondary | ICD-10-CM | POA: Diagnosis not present

## 2020-08-24 DIAGNOSIS — I10 Essential (primary) hypertension: Secondary | ICD-10-CM | POA: Diagnosis not present

## 2020-08-24 DIAGNOSIS — D649 Anemia, unspecified: Secondary | ICD-10-CM | POA: Diagnosis not present

## 2020-08-31 DIAGNOSIS — E538 Deficiency of other specified B group vitamins: Secondary | ICD-10-CM | POA: Diagnosis not present

## 2020-08-31 DIAGNOSIS — R809 Proteinuria, unspecified: Secondary | ICD-10-CM | POA: Diagnosis not present

## 2020-08-31 DIAGNOSIS — I10 Essential (primary) hypertension: Secondary | ICD-10-CM | POA: Diagnosis not present

## 2020-08-31 DIAGNOSIS — Z125 Encounter for screening for malignant neoplasm of prostate: Secondary | ICD-10-CM | POA: Diagnosis not present

## 2020-08-31 DIAGNOSIS — E7849 Other hyperlipidemia: Secondary | ICD-10-CM | POA: Diagnosis not present

## 2020-08-31 DIAGNOSIS — E1129 Type 2 diabetes mellitus with other diabetic kidney complication: Secondary | ICD-10-CM | POA: Diagnosis not present

## 2020-08-31 DIAGNOSIS — D649 Anemia, unspecified: Secondary | ICD-10-CM | POA: Diagnosis not present

## 2020-09-11 DIAGNOSIS — Z8673 Personal history of transient ischemic attack (TIA), and cerebral infarction without residual deficits: Secondary | ICD-10-CM | POA: Diagnosis not present

## 2020-09-11 DIAGNOSIS — H00012 Hordeolum externum right lower eyelid: Secondary | ICD-10-CM | POA: Diagnosis not present

## 2020-09-11 DIAGNOSIS — G43711 Chronic migraine without aura, intractable, with status migrainosus: Secondary | ICD-10-CM | POA: Diagnosis not present

## 2020-09-23 DIAGNOSIS — L570 Actinic keratosis: Secondary | ICD-10-CM | POA: Diagnosis not present

## 2020-09-23 DIAGNOSIS — L821 Other seborrheic keratosis: Secondary | ICD-10-CM | POA: Diagnosis not present

## 2020-09-23 DIAGNOSIS — D2271 Melanocytic nevi of right lower limb, including hip: Secondary | ICD-10-CM | POA: Diagnosis not present

## 2020-09-24 DIAGNOSIS — H0012 Chalazion right lower eyelid: Secondary | ICD-10-CM | POA: Diagnosis not present

## 2020-10-05 DIAGNOSIS — B351 Tinea unguium: Secondary | ICD-10-CM | POA: Diagnosis not present

## 2020-10-05 DIAGNOSIS — E1142 Type 2 diabetes mellitus with diabetic polyneuropathy: Secondary | ICD-10-CM | POA: Diagnosis not present

## 2020-12-03 DIAGNOSIS — E119 Type 2 diabetes mellitus without complications: Secondary | ICD-10-CM | POA: Diagnosis not present

## 2020-12-03 DIAGNOSIS — H47013 Ischemic optic neuropathy, bilateral: Secondary | ICD-10-CM | POA: Diagnosis not present

## 2020-12-23 DIAGNOSIS — R519 Headache, unspecified: Secondary | ICD-10-CM | POA: Diagnosis not present

## 2020-12-23 DIAGNOSIS — H9201 Otalgia, right ear: Secondary | ICD-10-CM | POA: Diagnosis not present

## 2020-12-23 DIAGNOSIS — E1129 Type 2 diabetes mellitus with other diabetic kidney complication: Secondary | ICD-10-CM | POA: Diagnosis not present

## 2020-12-23 DIAGNOSIS — R809 Proteinuria, unspecified: Secondary | ICD-10-CM | POA: Diagnosis not present

## 2021-01-06 DIAGNOSIS — Z23 Encounter for immunization: Secondary | ICD-10-CM | POA: Diagnosis not present

## 2021-01-06 DIAGNOSIS — I872 Venous insufficiency (chronic) (peripheral): Secondary | ICD-10-CM | POA: Diagnosis not present

## 2021-01-06 DIAGNOSIS — L603 Nail dystrophy: Secondary | ICD-10-CM | POA: Diagnosis not present

## 2021-01-06 DIAGNOSIS — E1142 Type 2 diabetes mellitus with diabetic polyneuropathy: Secondary | ICD-10-CM | POA: Diagnosis not present

## 2021-02-11 DIAGNOSIS — Z8673 Personal history of transient ischemic attack (TIA), and cerebral infarction without residual deficits: Secondary | ICD-10-CM | POA: Diagnosis not present

## 2021-02-11 DIAGNOSIS — G43711 Chronic migraine without aura, intractable, with status migrainosus: Secondary | ICD-10-CM | POA: Diagnosis not present

## 2021-02-24 DIAGNOSIS — R809 Proteinuria, unspecified: Secondary | ICD-10-CM | POA: Diagnosis not present

## 2021-02-24 DIAGNOSIS — D649 Anemia, unspecified: Secondary | ICD-10-CM | POA: Diagnosis not present

## 2021-02-24 DIAGNOSIS — Z125 Encounter for screening for malignant neoplasm of prostate: Secondary | ICD-10-CM | POA: Diagnosis not present

## 2021-02-24 DIAGNOSIS — E7849 Other hyperlipidemia: Secondary | ICD-10-CM | POA: Diagnosis not present

## 2021-02-24 DIAGNOSIS — E1129 Type 2 diabetes mellitus with other diabetic kidney complication: Secondary | ICD-10-CM | POA: Diagnosis not present

## 2021-02-24 DIAGNOSIS — E538 Deficiency of other specified B group vitamins: Secondary | ICD-10-CM | POA: Diagnosis not present

## 2021-03-03 DIAGNOSIS — E1129 Type 2 diabetes mellitus with other diabetic kidney complication: Secondary | ICD-10-CM | POA: Diagnosis not present

## 2021-03-03 DIAGNOSIS — K2270 Barrett's esophagus without dysplasia: Secondary | ICD-10-CM | POA: Diagnosis not present

## 2021-03-03 DIAGNOSIS — D649 Anemia, unspecified: Secondary | ICD-10-CM | POA: Diagnosis not present

## 2021-03-03 DIAGNOSIS — R519 Headache, unspecified: Secondary | ICD-10-CM | POA: Diagnosis not present

## 2021-03-03 DIAGNOSIS — M5416 Radiculopathy, lumbar region: Secondary | ICD-10-CM | POA: Diagnosis not present

## 2021-03-03 DIAGNOSIS — Z0000 Encounter for general adult medical examination without abnormal findings: Secondary | ICD-10-CM | POA: Diagnosis not present

## 2021-03-03 DIAGNOSIS — I10 Essential (primary) hypertension: Secondary | ICD-10-CM | POA: Diagnosis not present

## 2021-03-03 DIAGNOSIS — E7849 Other hyperlipidemia: Secondary | ICD-10-CM | POA: Diagnosis not present

## 2021-03-03 DIAGNOSIS — E538 Deficiency of other specified B group vitamins: Secondary | ICD-10-CM | POA: Diagnosis not present

## 2021-03-03 DIAGNOSIS — R809 Proteinuria, unspecified: Secondary | ICD-10-CM | POA: Diagnosis not present

## 2021-03-29 DIAGNOSIS — L578 Other skin changes due to chronic exposure to nonionizing radiation: Secondary | ICD-10-CM | POA: Diagnosis not present

## 2021-03-29 DIAGNOSIS — L570 Actinic keratosis: Secondary | ICD-10-CM | POA: Diagnosis not present

## 2021-03-29 DIAGNOSIS — D225 Melanocytic nevi of trunk: Secondary | ICD-10-CM | POA: Diagnosis not present

## 2021-03-29 DIAGNOSIS — L2089 Other atopic dermatitis: Secondary | ICD-10-CM | POA: Diagnosis not present

## 2021-03-29 DIAGNOSIS — D485 Neoplasm of uncertain behavior of skin: Secondary | ICD-10-CM | POA: Diagnosis not present

## 2021-03-29 DIAGNOSIS — L918 Other hypertrophic disorders of the skin: Secondary | ICD-10-CM | POA: Diagnosis not present

## 2021-03-29 DIAGNOSIS — Z872 Personal history of diseases of the skin and subcutaneous tissue: Secondary | ICD-10-CM | POA: Diagnosis not present

## 2021-04-09 DIAGNOSIS — B351 Tinea unguium: Secondary | ICD-10-CM | POA: Diagnosis not present

## 2021-04-09 DIAGNOSIS — M79674 Pain in right toe(s): Secondary | ICD-10-CM | POA: Diagnosis not present

## 2021-04-09 DIAGNOSIS — M79675 Pain in left toe(s): Secondary | ICD-10-CM | POA: Diagnosis not present

## 2021-04-29 DIAGNOSIS — D235 Other benign neoplasm of skin of trunk: Secondary | ICD-10-CM | POA: Diagnosis not present

## 2021-04-29 DIAGNOSIS — L988 Other specified disorders of the skin and subcutaneous tissue: Secondary | ICD-10-CM | POA: Diagnosis not present

## 2021-05-13 DIAGNOSIS — D235 Other benign neoplasm of skin of trunk: Secondary | ICD-10-CM | POA: Diagnosis not present

## 2021-05-13 DIAGNOSIS — L988 Other specified disorders of the skin and subcutaneous tissue: Secondary | ICD-10-CM | POA: Diagnosis not present

## 2021-06-10 IMAGING — MR MR LUMBAR SPINE W/O CM
5 series · 31 of 48 positions shown · non-contrast
Comparison: None.

CLINICAL DATA: Low back and right leg pain for a few months.

EXAM:
MRI LUMBAR SPINE WITHOUT CONTRAST
TECHNIQUE: Multiplanar, multisequence MR imaging of the lumbar spine was
performed. No intravenous contrast was administered.

[Series 9: T2 · sagittal · 4.0mm · 0.81mm/px · 7 of 17 slices shown (1 of 2)]
[im 1/17]
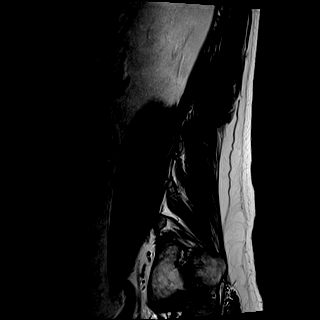
[im 3/17]
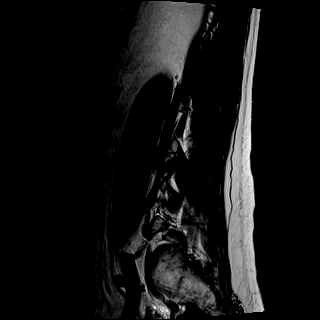
[im 6/17]
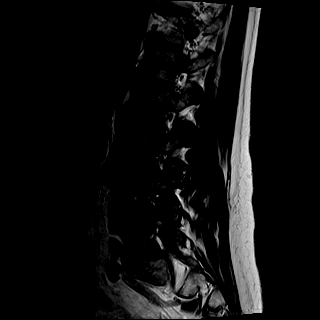
[im 9/17]
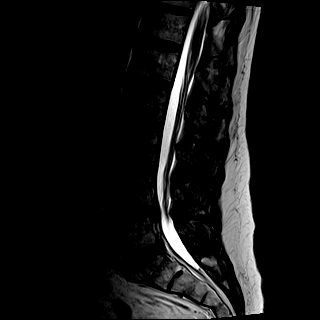
[im 11/17]
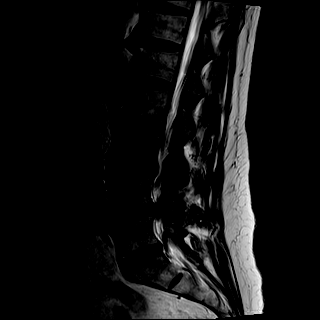
[im 14/17]
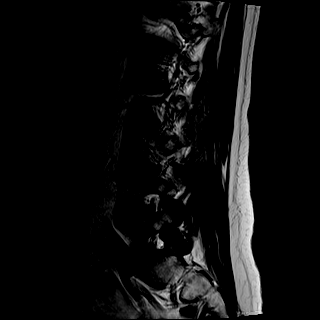
[im 17/17]
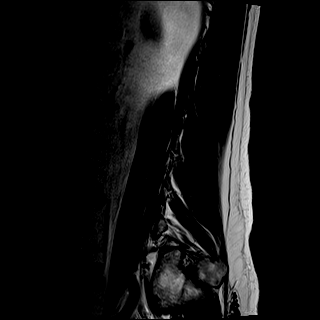

[Series 10: T1 · sagittal · 4.0mm · 0.81mm/px · 7 of 17 slices shown (1 of 2)]
[im 1/17]
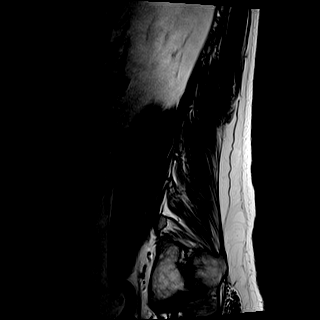
[im 3/17]
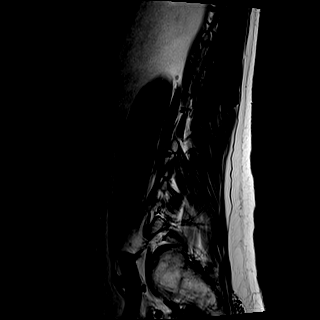
[im 6/17]
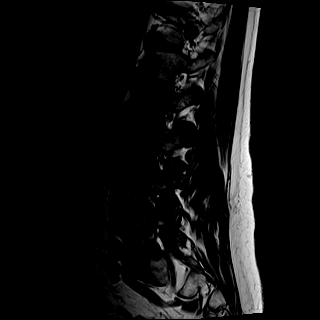
[im 9/17]
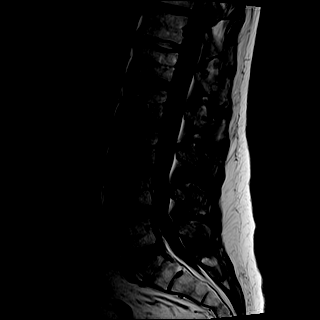
[im 11/17]
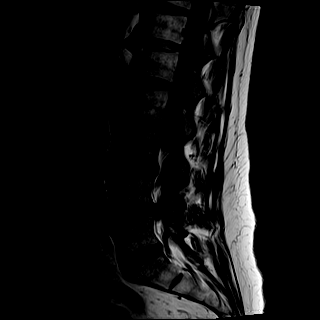
[im 14/17]
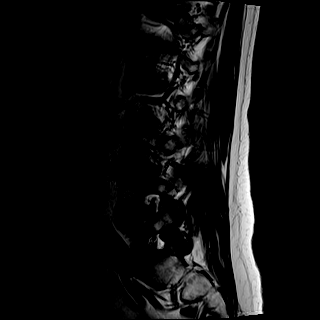
[im 17/17]
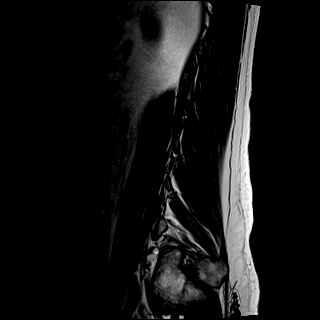

[Series 11: STIR · sagittal · 4.0mm · 0.41mm/px · 1 of 17 slices shown]
[im 1/17]
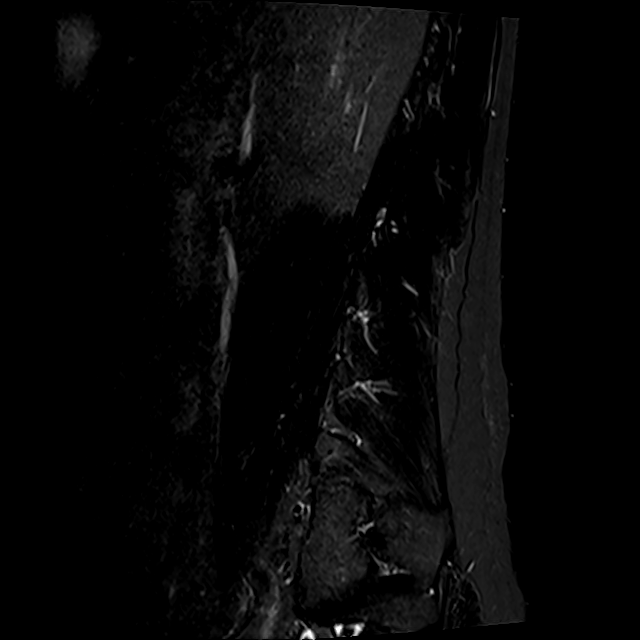

[Series 12: T2 · axial · 4.0mm · 0.78mm/px · z∈[+84,+299]mm · 8 of 38 slices shown (2 of 2)]
[im 1/38]
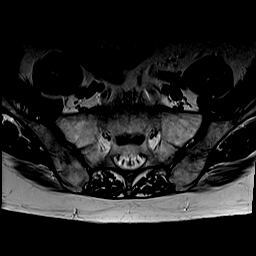
[im 6/38]
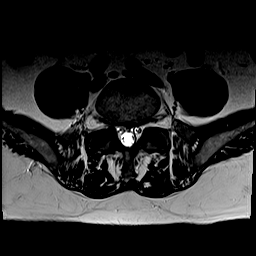
[im 12/38]
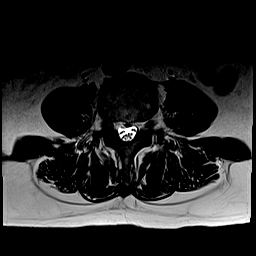
[im 18/38]
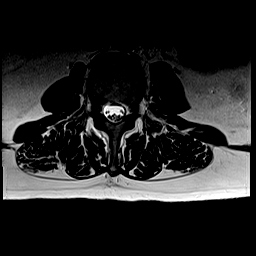
[im 20/38]
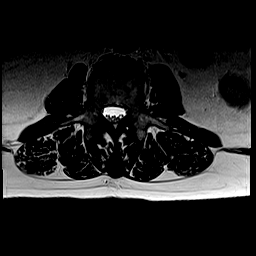
[im 26/38]
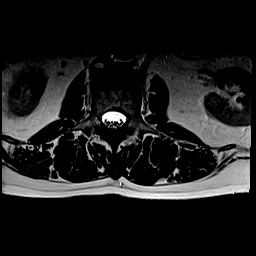
[im 32/38]
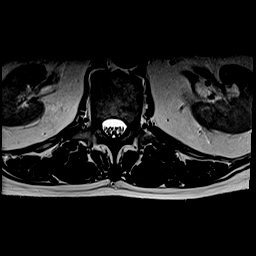
[im 38/38]
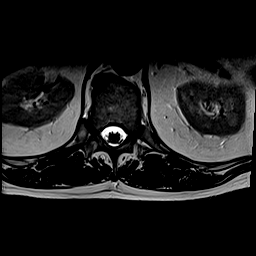

[Series 13: T1 · axial · 4.0mm · 0.39mm/px · z∈[+84,+299]mm · 8 of 38 slices shown (2 of 2)]
[im 1/38]
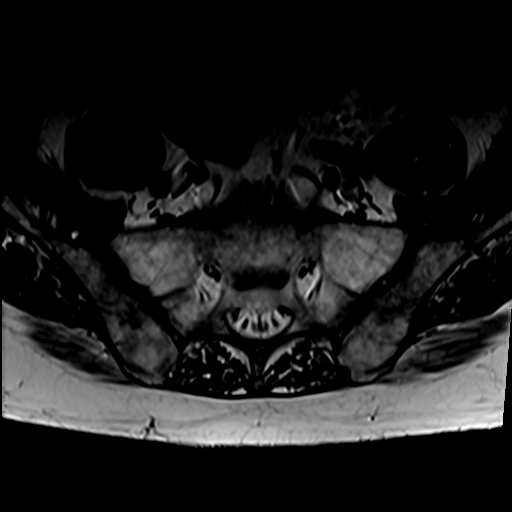
[im 6/38]
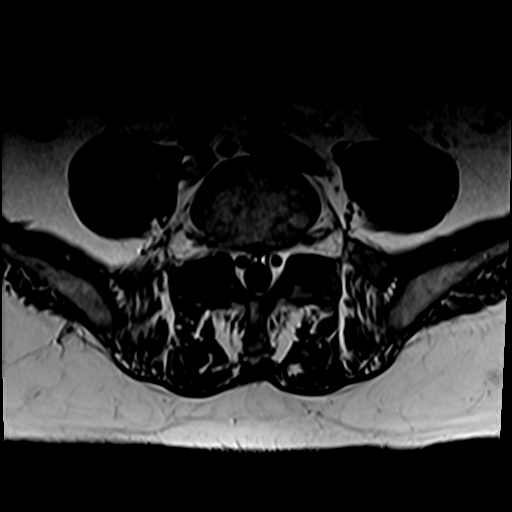
[im 12/38]
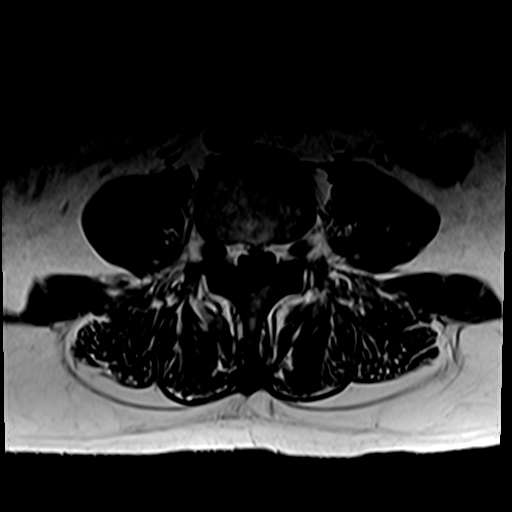
[im 18/38]
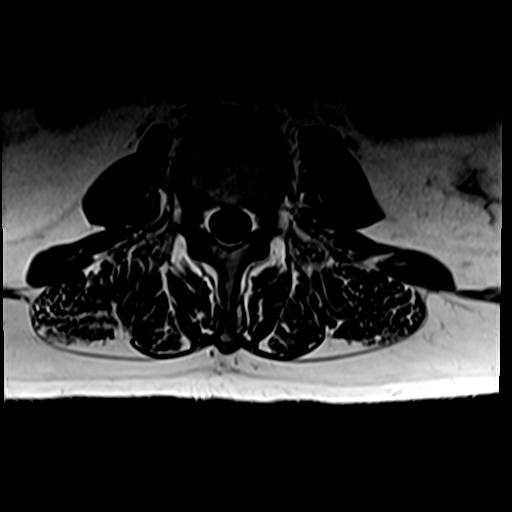
[im 20/38]
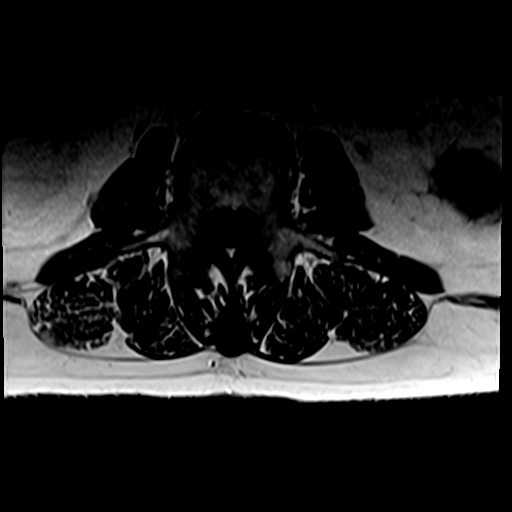
[im 26/38]
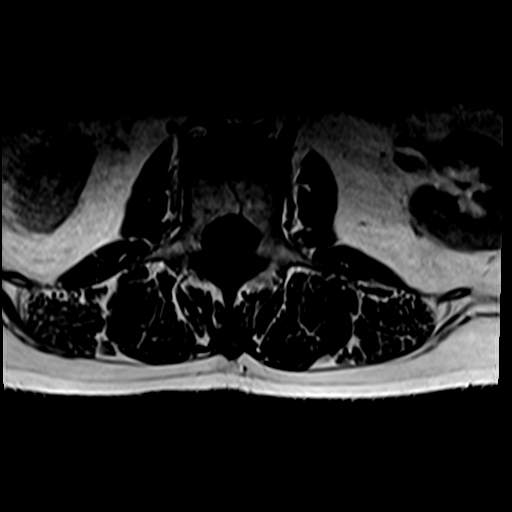
[im 32/38]
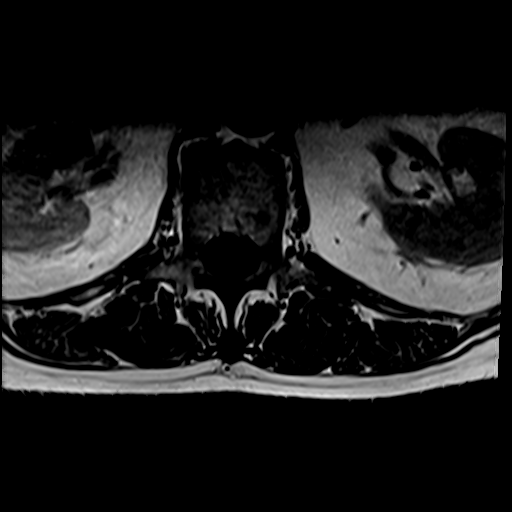
[im 38/38]
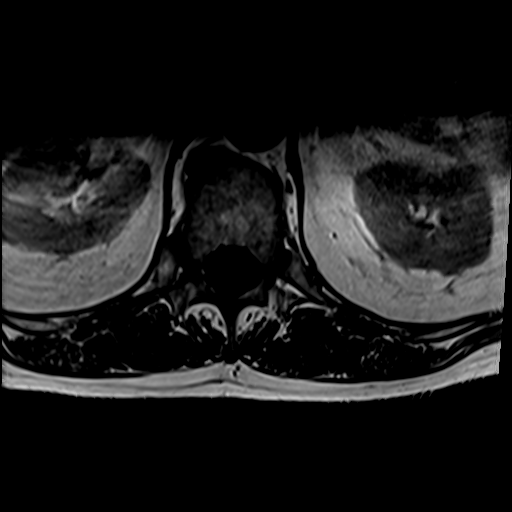

[31 of 48 positions shown; findings below may reference images not displayed]

FINDINGS: Segmentation:  Standard.

Alignment:  3 mm retrolisthesis of L4 on L5.

Vertebrae: No fracture, suspicious osseous lesion, or significant
marrow edema.

Conus medullaris and cauda equina: Conus extends to the L1 level.
Conus and cauda equina appear normal.

Paraspinal and other soft tissues: Unremarkable.

Disc levels:

Disc desiccation throughout the lumbar spine with preserved disc
space heights.

T11-12 and T12-L1: Mild spondylosis without disc herniation or
stenosis.

L1-2: Negative.

L2-3: Mild facet hypertrophy without disc herniation or stenosis.

L3-4: Mild disc bulging and moderate facet and ligamentum flavum
hypertrophy result in borderline left lateral recess stenosis and
mild right and borderline left neural foraminal stenosis without
spinal stenosis.

L4-5: Retrolisthesis with bulging uncovered disc, a central pseudo
disc extrusion with mild inferior migration, and moderate facet and
ligamentum flavum hypertrophy result in moderate spinal stenosis,
right greater than left lateral recess stenosis, and moderate right
and mild left neural foraminal stenosis. L5 nerve root impingement
is possible bilaterally, particularly on the right.

L5-S1: Tiny central disc protrusion and mild to moderate right and
mild left facet hypertrophy without stenosis.
IMPRESSION: Lumbar spondylosis and facet degeneration, most notable at L4-5
where there is moderate spinal stenosis and right greater than left
lateral recess and neural foraminal stenosis.

## 2021-06-12 IMAGING — CT CT ANGIO CHEST
2 of 6 series · 19 of 46 positions shown · IV contrast (APPLIED)
Comparison: Chest x-ray from same day. PET-CT dated May 15, 2019.

CLINICAL DATA: Dizziness and weakness. Multiple falls. Denies chest
pain or shortness of breath.

EXAM:
CT ANGIOGRAPHY CHEST WITH CONTRAST
TECHNIQUE: Multidetector CT imaging of the chest was performed using the
standard protocol during bolus administration of intravenous
contrast. Multiplanar CT image reconstructions and MIPs were
obtained to evaluate the vascular anatomy.
CONTRAST:  75mL OMNIPAQUE IOHEXOL 350 MG/ML SOLN

[Series 5: thins · axial · 0.74mm/px · z∈[+256,+509]mm · 17 of 279 slices shown]
[im 13/279  lung]
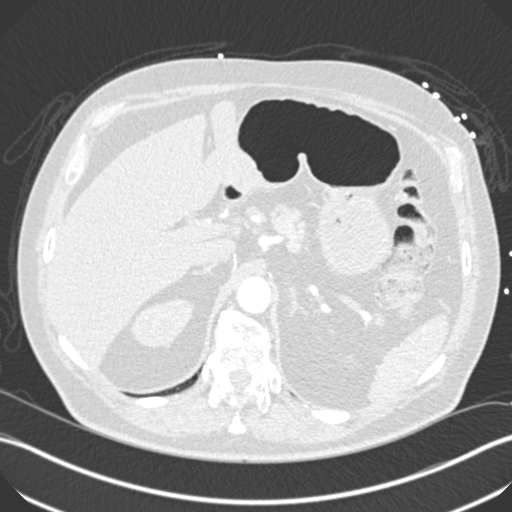
[im 25/279  soft-tissue]
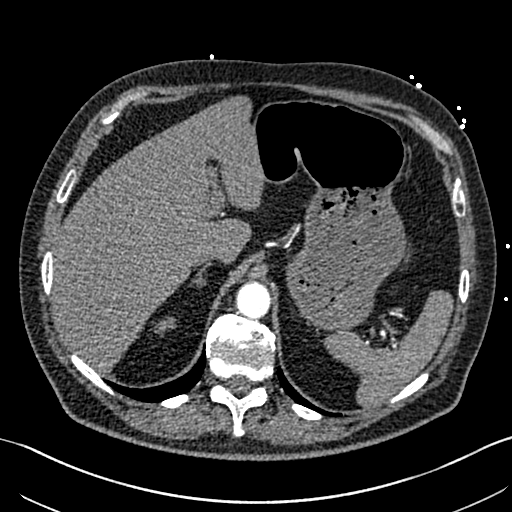
[im 49/279  lung]
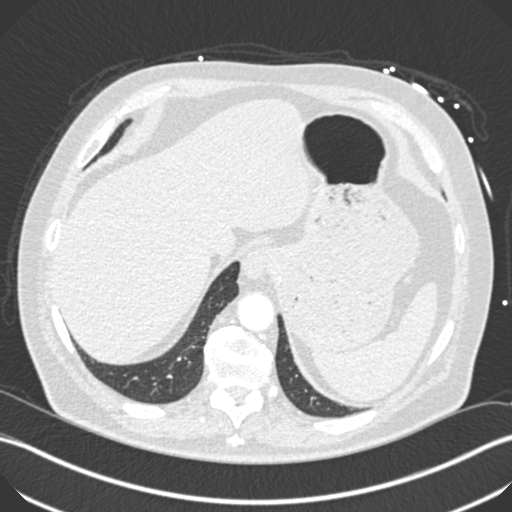
[im 61/279  soft-tissue]
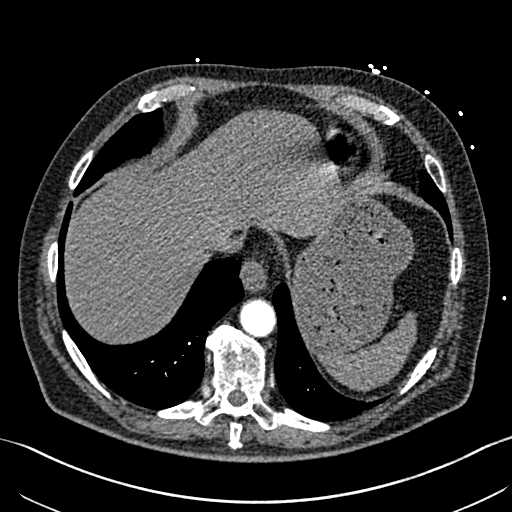
[im 73/279  lung]
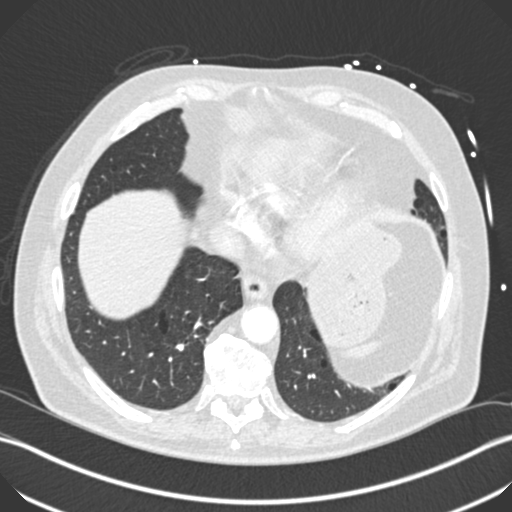
[im 97/279  soft-tissue]
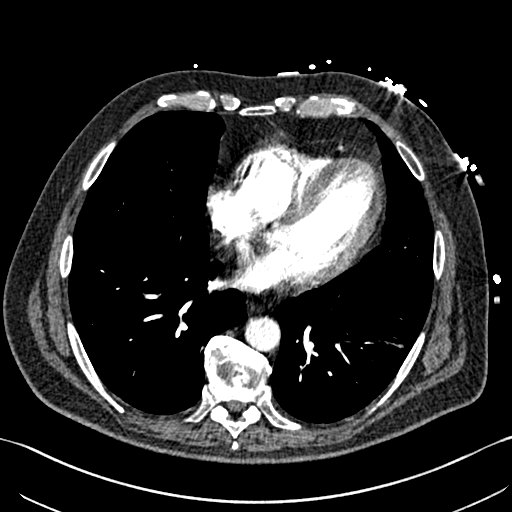
[im 109/279  lung]
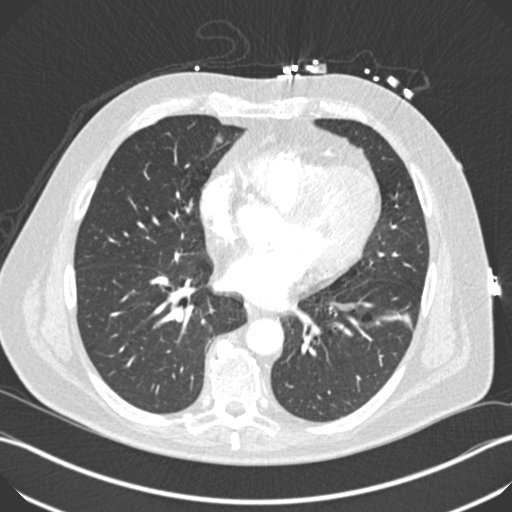
[im 121/279  soft-tissue]
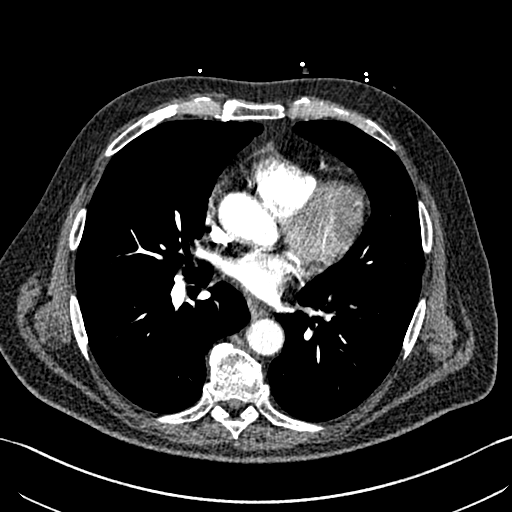
[im 146/279  lung]
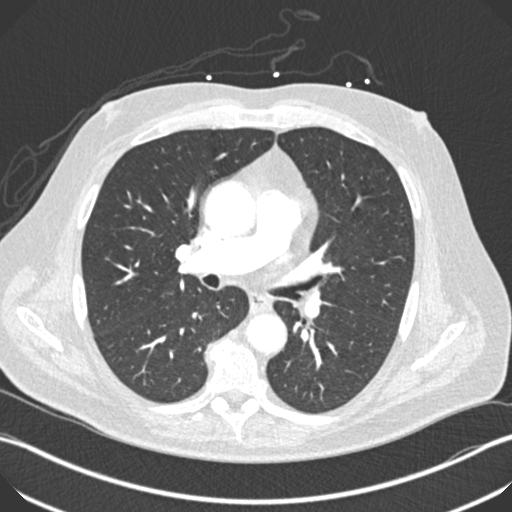
[im 158/279  soft-tissue]
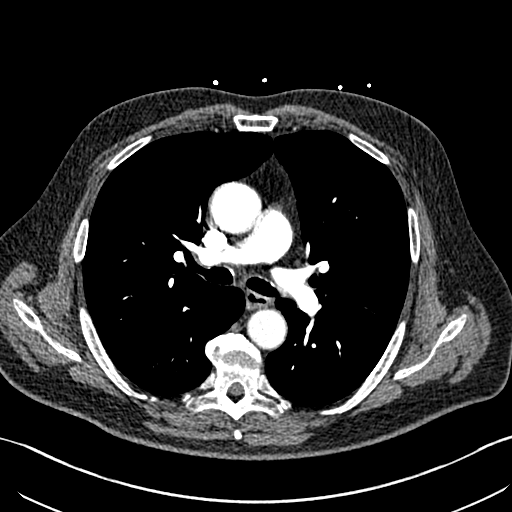
[im 170/279  lung]
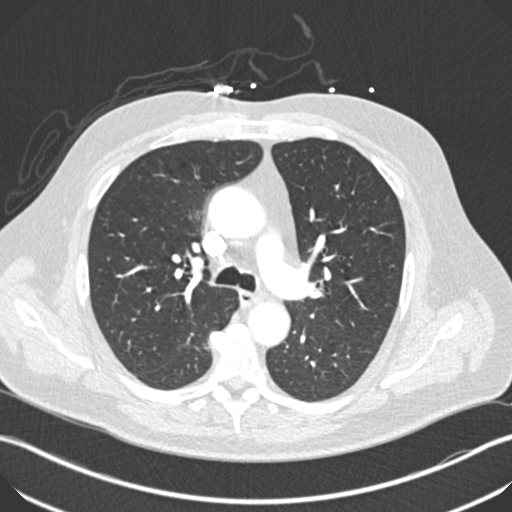
[im 182/279  soft-tissue]
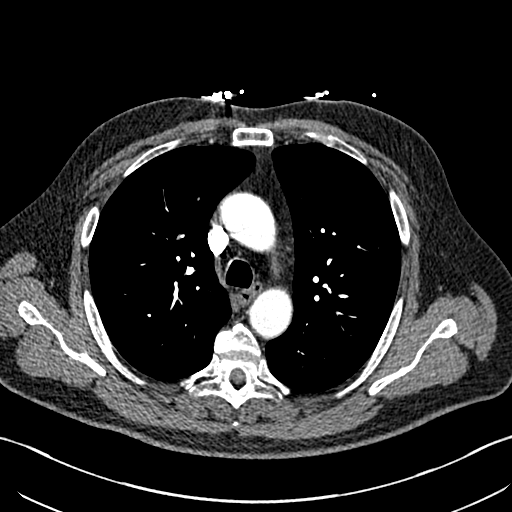
[im 206/279  lung]
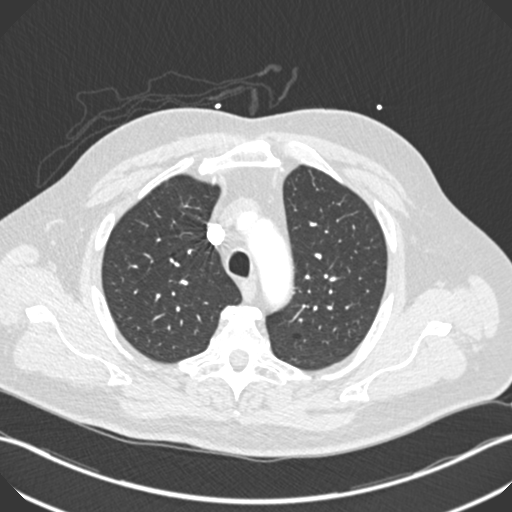
[im 218/279  soft-tissue]
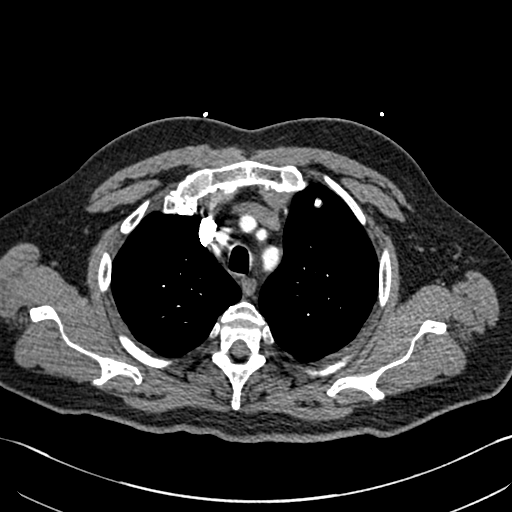
[im 230/279  lung]
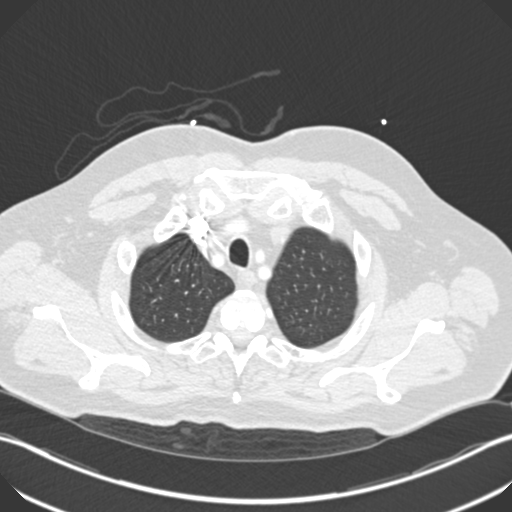
[im 254/279  soft-tissue]
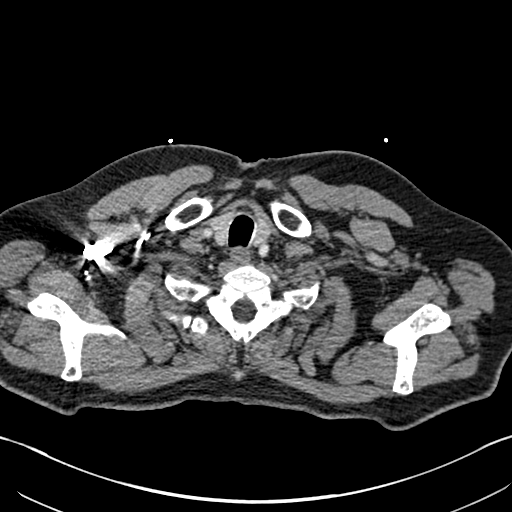
[im 266/279  lung]
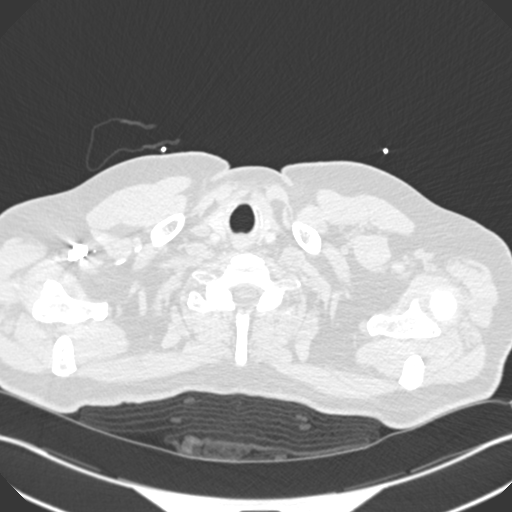

[Series 7: coronal mpr · coronal · 0.55mm/px · 2 of 98 slices shown]
[im 33/98  soft-tissue]
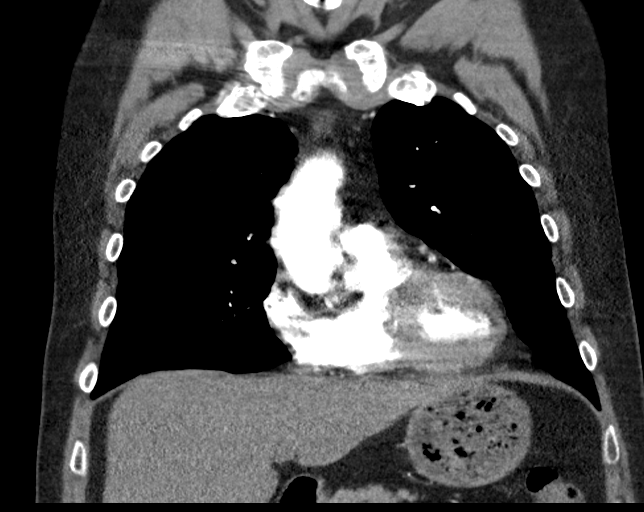
[im 65/98  soft-tissue]
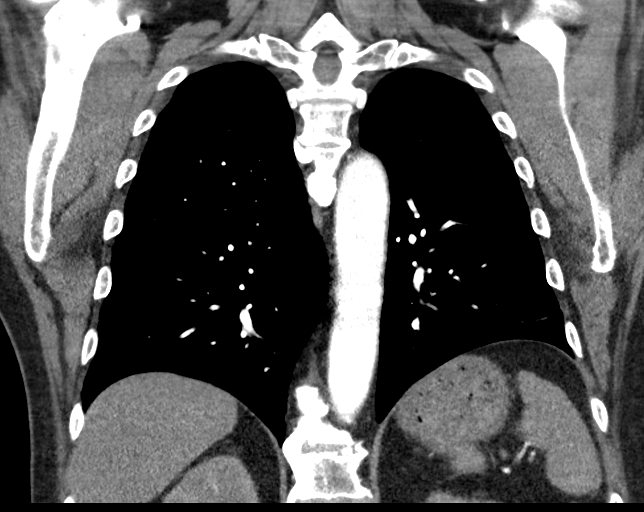

[19 of 46 positions shown; findings below may reference images not displayed]

FINDINGS: Cardiovascular: Satisfactory opacification of the pulmonary arteries
to the segmental level. No evidence of pulmonary embolism. Normal
heart size. No pericardial effusion. No thoracic aortic aneurysm or
dissection.

Mediastinum/Nodes: No enlarged mediastinal, hilar, or axillary lymph
nodes. Thyroid gland, trachea, and esophagus demonstrate no
significant findings.

Lungs/Pleura: No focal consolidation, pleural effusion, or
pneumothorax. No suspicious pulmonary nodule. A few small scattered
simple thin-walled cysts in both lungs again noted.

Upper Abdomen: No acute abnormality. Unchanged hepatic steatosis.

Musculoskeletal: No chest wall abnormality. No acute or significant
osseous findings.

Review of the MIP images confirms the above findings.
IMPRESSION: 1. No evidence of pulmonary embolism. No acute intrathoracic
process.
2. Unchanged hepatic steatosis.

## 2021-06-18 DIAGNOSIS — R519 Headache, unspecified: Secondary | ICD-10-CM | POA: Diagnosis not present

## 2021-06-18 DIAGNOSIS — Q180 Sinus, fistula and cyst of branchial cleft: Secondary | ICD-10-CM | POA: Diagnosis not present

## 2021-07-08 DIAGNOSIS — K449 Diaphragmatic hernia without obstruction or gangrene: Secondary | ICD-10-CM | POA: Diagnosis not present

## 2021-07-08 DIAGNOSIS — D649 Anemia, unspecified: Secondary | ICD-10-CM | POA: Diagnosis not present

## 2021-07-08 DIAGNOSIS — E785 Hyperlipidemia, unspecified: Secondary | ICD-10-CM | POA: Diagnosis not present

## 2021-07-08 DIAGNOSIS — Z7984 Long term (current) use of oral hypoglycemic drugs: Secondary | ICD-10-CM | POA: Diagnosis not present

## 2021-07-08 DIAGNOSIS — K22710 Barrett's esophagus with low grade dysplasia: Secondary | ICD-10-CM | POA: Diagnosis not present

## 2021-07-08 DIAGNOSIS — I10 Essential (primary) hypertension: Secondary | ICD-10-CM | POA: Diagnosis not present

## 2021-07-08 DIAGNOSIS — K2270 Barrett's esophagus without dysplasia: Secondary | ICD-10-CM | POA: Diagnosis not present

## 2021-08-10 DIAGNOSIS — R001 Bradycardia, unspecified: Secondary | ICD-10-CM | POA: Diagnosis not present

## 2021-08-10 DIAGNOSIS — H548 Legal blindness, as defined in USA: Secondary | ICD-10-CM | POA: Diagnosis not present

## 2021-08-10 DIAGNOSIS — G43909 Migraine, unspecified, not intractable, without status migrainosus: Secondary | ICD-10-CM | POA: Diagnosis not present

## 2021-08-10 DIAGNOSIS — Q069 Congenital malformation of spinal cord, unspecified: Secondary | ICD-10-CM | POA: Diagnosis not present

## 2021-08-10 DIAGNOSIS — R7303 Prediabetes: Secondary | ICD-10-CM | POA: Diagnosis not present

## 2021-08-10 DIAGNOSIS — E785 Hyperlipidemia, unspecified: Secondary | ICD-10-CM | POA: Diagnosis not present

## 2021-08-10 DIAGNOSIS — K219 Gastro-esophageal reflux disease without esophagitis: Secondary | ICD-10-CM | POA: Diagnosis not present

## 2021-08-10 DIAGNOSIS — I10 Essential (primary) hypertension: Secondary | ICD-10-CM | POA: Diagnosis not present

## 2021-08-10 DIAGNOSIS — I679 Cerebrovascular disease, unspecified: Secondary | ICD-10-CM | POA: Diagnosis not present

## 2021-08-10 DIAGNOSIS — G4700 Insomnia, unspecified: Secondary | ICD-10-CM | POA: Diagnosis not present

## 2021-08-10 DIAGNOSIS — E669 Obesity, unspecified: Secondary | ICD-10-CM | POA: Diagnosis not present

## 2021-08-10 DIAGNOSIS — Z7902 Long term (current) use of antithrombotics/antiplatelets: Secondary | ICD-10-CM | POA: Diagnosis not present

## 2021-08-26 DIAGNOSIS — E1129 Type 2 diabetes mellitus with other diabetic kidney complication: Secondary | ICD-10-CM | POA: Diagnosis not present

## 2021-08-26 DIAGNOSIS — I10 Essential (primary) hypertension: Secondary | ICD-10-CM | POA: Diagnosis not present

## 2021-08-26 DIAGNOSIS — E7849 Other hyperlipidemia: Secondary | ICD-10-CM | POA: Diagnosis not present

## 2021-08-26 DIAGNOSIS — R809 Proteinuria, unspecified: Secondary | ICD-10-CM | POA: Diagnosis not present

## 2021-08-26 DIAGNOSIS — E538 Deficiency of other specified B group vitamins: Secondary | ICD-10-CM | POA: Diagnosis not present

## 2021-08-27 DIAGNOSIS — M79675 Pain in left toe(s): Secondary | ICD-10-CM | POA: Diagnosis not present

## 2021-08-27 DIAGNOSIS — B351 Tinea unguium: Secondary | ICD-10-CM | POA: Diagnosis not present

## 2021-08-27 DIAGNOSIS — M79674 Pain in right toe(s): Secondary | ICD-10-CM | POA: Diagnosis not present

## 2021-09-02 DIAGNOSIS — Z0000 Encounter for general adult medical examination without abnormal findings: Secondary | ICD-10-CM | POA: Diagnosis not present

## 2021-09-02 DIAGNOSIS — K2270 Barrett's esophagus without dysplasia: Secondary | ICD-10-CM | POA: Diagnosis not present

## 2021-09-02 DIAGNOSIS — R519 Headache, unspecified: Secondary | ICD-10-CM | POA: Diagnosis not present

## 2021-09-02 DIAGNOSIS — Z86007 Personal history of in-situ neoplasm of skin: Secondary | ICD-10-CM | POA: Diagnosis not present

## 2021-09-02 DIAGNOSIS — E1129 Type 2 diabetes mellitus with other diabetic kidney complication: Secondary | ICD-10-CM | POA: Diagnosis not present

## 2021-09-02 DIAGNOSIS — E538 Deficiency of other specified B group vitamins: Secondary | ICD-10-CM | POA: Diagnosis not present

## 2021-09-02 DIAGNOSIS — Z125 Encounter for screening for malignant neoplasm of prostate: Secondary | ICD-10-CM | POA: Diagnosis not present

## 2021-09-02 DIAGNOSIS — R490 Dysphonia: Secondary | ICD-10-CM | POA: Diagnosis not present

## 2021-09-02 DIAGNOSIS — I10 Essential (primary) hypertension: Secondary | ICD-10-CM | POA: Diagnosis not present

## 2021-09-02 DIAGNOSIS — R809 Proteinuria, unspecified: Secondary | ICD-10-CM | POA: Diagnosis not present

## 2021-09-02 DIAGNOSIS — D649 Anemia, unspecified: Secondary | ICD-10-CM | POA: Diagnosis not present

## 2021-09-02 DIAGNOSIS — E7849 Other hyperlipidemia: Secondary | ICD-10-CM | POA: Diagnosis not present

## 2021-09-16 DIAGNOSIS — Z8673 Personal history of transient ischemic attack (TIA), and cerebral infarction without residual deficits: Secondary | ICD-10-CM | POA: Diagnosis not present

## 2021-09-16 DIAGNOSIS — G43711 Chronic migraine without aura, intractable, with status migrainosus: Secondary | ICD-10-CM | POA: Diagnosis not present

## 2021-11-16 DIAGNOSIS — Z872 Personal history of diseases of the skin and subcutaneous tissue: Secondary | ICD-10-CM | POA: Diagnosis not present

## 2021-11-16 DIAGNOSIS — L918 Other hypertrophic disorders of the skin: Secondary | ICD-10-CM | POA: Diagnosis not present

## 2021-11-16 DIAGNOSIS — L720 Epidermal cyst: Secondary | ICD-10-CM | POA: Diagnosis not present

## 2021-11-16 DIAGNOSIS — L570 Actinic keratosis: Secondary | ICD-10-CM | POA: Diagnosis not present

## 2021-11-16 DIAGNOSIS — Z86018 Personal history of other benign neoplasm: Secondary | ICD-10-CM | POA: Diagnosis not present

## 2021-11-16 DIAGNOSIS — L2089 Other atopic dermatitis: Secondary | ICD-10-CM | POA: Diagnosis not present

## 2021-11-16 DIAGNOSIS — L578 Other skin changes due to chronic exposure to nonionizing radiation: Secondary | ICD-10-CM | POA: Diagnosis not present

## 2021-12-09 DIAGNOSIS — E119 Type 2 diabetes mellitus without complications: Secondary | ICD-10-CM | POA: Diagnosis not present

## 2021-12-10 DIAGNOSIS — L603 Nail dystrophy: Secondary | ICD-10-CM | POA: Diagnosis not present

## 2021-12-10 DIAGNOSIS — E1142 Type 2 diabetes mellitus with diabetic polyneuropathy: Secondary | ICD-10-CM | POA: Diagnosis not present

## 2022-02-14 DIAGNOSIS — M1712 Unilateral primary osteoarthritis, left knee: Secondary | ICD-10-CM | POA: Diagnosis not present

## 2022-02-14 DIAGNOSIS — M25562 Pain in left knee: Secondary | ICD-10-CM | POA: Diagnosis not present

## 2022-03-01 DIAGNOSIS — E7849 Other hyperlipidemia: Secondary | ICD-10-CM | POA: Diagnosis not present

## 2022-03-01 DIAGNOSIS — E538 Deficiency of other specified B group vitamins: Secondary | ICD-10-CM | POA: Diagnosis not present

## 2022-03-01 DIAGNOSIS — Z125 Encounter for screening for malignant neoplasm of prostate: Secondary | ICD-10-CM | POA: Diagnosis not present

## 2022-03-01 DIAGNOSIS — E1129 Type 2 diabetes mellitus with other diabetic kidney complication: Secondary | ICD-10-CM | POA: Diagnosis not present

## 2022-03-01 DIAGNOSIS — R809 Proteinuria, unspecified: Secondary | ICD-10-CM | POA: Diagnosis not present

## 2022-03-03 DIAGNOSIS — G43711 Chronic migraine without aura, intractable, with status migrainosus: Secondary | ICD-10-CM | POA: Diagnosis not present

## 2022-03-03 DIAGNOSIS — Z8673 Personal history of transient ischemic attack (TIA), and cerebral infarction without residual deficits: Secondary | ICD-10-CM | POA: Diagnosis not present

## 2022-03-08 DIAGNOSIS — G43C0 Periodic headache syndromes in child or adult, not intractable: Secondary | ICD-10-CM | POA: Diagnosis not present

## 2022-03-08 DIAGNOSIS — R809 Proteinuria, unspecified: Secondary | ICD-10-CM | POA: Diagnosis not present

## 2022-03-08 DIAGNOSIS — I10 Essential (primary) hypertension: Secondary | ICD-10-CM | POA: Diagnosis not present

## 2022-03-08 DIAGNOSIS — R519 Headache, unspecified: Secondary | ICD-10-CM | POA: Diagnosis not present

## 2022-03-08 DIAGNOSIS — E538 Deficiency of other specified B group vitamins: Secondary | ICD-10-CM | POA: Diagnosis not present

## 2022-03-08 DIAGNOSIS — R768 Other specified abnormal immunological findings in serum: Secondary | ICD-10-CM | POA: Diagnosis not present

## 2022-03-08 DIAGNOSIS — E7849 Other hyperlipidemia: Secondary | ICD-10-CM | POA: Diagnosis not present

## 2022-03-08 DIAGNOSIS — E1129 Type 2 diabetes mellitus with other diabetic kidney complication: Secondary | ICD-10-CM | POA: Diagnosis not present

## 2022-03-08 DIAGNOSIS — Z0000 Encounter for general adult medical examination without abnormal findings: Secondary | ICD-10-CM | POA: Diagnosis not present

## 2022-03-08 DIAGNOSIS — D649 Anemia, unspecified: Secondary | ICD-10-CM | POA: Diagnosis not present

## 2022-03-08 DIAGNOSIS — K2270 Barrett's esophagus without dysplasia: Secondary | ICD-10-CM | POA: Diagnosis not present

## 2022-03-18 DIAGNOSIS — I872 Venous insufficiency (chronic) (peripheral): Secondary | ICD-10-CM | POA: Diagnosis not present

## 2022-03-18 DIAGNOSIS — S93492A Sprain of other ligament of left ankle, initial encounter: Secondary | ICD-10-CM | POA: Diagnosis not present

## 2022-03-18 DIAGNOSIS — B351 Tinea unguium: Secondary | ICD-10-CM | POA: Diagnosis not present

## 2022-03-18 DIAGNOSIS — E1142 Type 2 diabetes mellitus with diabetic polyneuropathy: Secondary | ICD-10-CM | POA: Diagnosis not present

## 2022-03-18 DIAGNOSIS — L600 Ingrowing nail: Secondary | ICD-10-CM | POA: Diagnosis not present

## 2022-03-18 DIAGNOSIS — M79675 Pain in left toe(s): Secondary | ICD-10-CM | POA: Diagnosis not present

## 2022-03-18 DIAGNOSIS — L603 Nail dystrophy: Secondary | ICD-10-CM | POA: Diagnosis not present

## 2022-03-18 DIAGNOSIS — M79674 Pain in right toe(s): Secondary | ICD-10-CM | POA: Diagnosis not present

## 2022-03-18 DIAGNOSIS — M25572 Pain in left ankle and joints of left foot: Secondary | ICD-10-CM | POA: Diagnosis not present

## 2022-06-20 DIAGNOSIS — R519 Headache, unspecified: Secondary | ICD-10-CM | POA: Diagnosis not present

## 2022-06-20 DIAGNOSIS — Q180 Sinus, fistula and cyst of branchial cleft: Secondary | ICD-10-CM | POA: Diagnosis not present

## 2022-06-20 DIAGNOSIS — H6063 Unspecified chronic otitis externa, bilateral: Secondary | ICD-10-CM | POA: Diagnosis not present

## 2022-06-20 DIAGNOSIS — K219 Gastro-esophageal reflux disease without esophagitis: Secondary | ICD-10-CM | POA: Diagnosis not present

## 2022-06-20 DIAGNOSIS — R1314 Dysphagia, pharyngoesophageal phase: Secondary | ICD-10-CM | POA: Diagnosis not present

## 2022-06-22 DIAGNOSIS — E1142 Type 2 diabetes mellitus with diabetic polyneuropathy: Secondary | ICD-10-CM | POA: Diagnosis not present

## 2022-06-22 DIAGNOSIS — B351 Tinea unguium: Secondary | ICD-10-CM | POA: Diagnosis not present

## 2022-07-21 DIAGNOSIS — K2270 Barrett's esophagus without dysplasia: Secondary | ICD-10-CM | POA: Diagnosis not present

## 2022-07-21 DIAGNOSIS — K219 Gastro-esophageal reflux disease without esophagitis: Secondary | ICD-10-CM | POA: Diagnosis not present

## 2022-07-21 DIAGNOSIS — Z86007 Personal history of in-situ neoplasm of skin: Secondary | ICD-10-CM | POA: Diagnosis not present

## 2022-07-21 DIAGNOSIS — E1129 Type 2 diabetes mellitus with other diabetic kidney complication: Secondary | ICD-10-CM | POA: Diagnosis not present

## 2022-07-21 DIAGNOSIS — R1313 Dysphagia, pharyngeal phase: Secondary | ICD-10-CM | POA: Diagnosis not present

## 2022-07-21 DIAGNOSIS — K449 Diaphragmatic hernia without obstruction or gangrene: Secondary | ICD-10-CM | POA: Diagnosis not present

## 2022-07-21 DIAGNOSIS — R809 Proteinuria, unspecified: Secondary | ICD-10-CM | POA: Diagnosis not present

## 2022-08-29 DIAGNOSIS — M65321 Trigger finger, right index finger: Secondary | ICD-10-CM | POA: Diagnosis not present

## 2022-08-30 ENCOUNTER — Ambulatory Visit: Payer: PPO

## 2022-08-30 DIAGNOSIS — K2270 Barrett's esophagus without dysplasia: Secondary | ICD-10-CM | POA: Diagnosis not present

## 2022-08-30 DIAGNOSIS — R1313 Dysphagia, pharyngeal phase: Secondary | ICD-10-CM | POA: Diagnosis not present

## 2022-08-30 DIAGNOSIS — K449 Diaphragmatic hernia without obstruction or gangrene: Secondary | ICD-10-CM | POA: Diagnosis not present

## 2022-08-30 DIAGNOSIS — K208 Other esophagitis without bleeding: Secondary | ICD-10-CM | POA: Diagnosis not present

## 2022-08-30 DIAGNOSIS — R1013 Epigastric pain: Secondary | ICD-10-CM | POA: Diagnosis not present

## 2022-08-31 DIAGNOSIS — E1129 Type 2 diabetes mellitus with other diabetic kidney complication: Secondary | ICD-10-CM | POA: Diagnosis not present

## 2022-08-31 DIAGNOSIS — E7849 Other hyperlipidemia: Secondary | ICD-10-CM | POA: Diagnosis not present

## 2022-08-31 DIAGNOSIS — R809 Proteinuria, unspecified: Secondary | ICD-10-CM | POA: Diagnosis not present

## 2022-08-31 DIAGNOSIS — I10 Essential (primary) hypertension: Secondary | ICD-10-CM | POA: Diagnosis not present

## 2022-08-31 DIAGNOSIS — D649 Anemia, unspecified: Secondary | ICD-10-CM | POA: Diagnosis not present

## 2022-08-31 DIAGNOSIS — E538 Deficiency of other specified B group vitamins: Secondary | ICD-10-CM | POA: Diagnosis not present

## 2022-09-06 DIAGNOSIS — G43711 Chronic migraine without aura, intractable, with status migrainosus: Secondary | ICD-10-CM | POA: Diagnosis not present

## 2022-09-07 DIAGNOSIS — D649 Anemia, unspecified: Secondary | ICD-10-CM | POA: Diagnosis not present

## 2022-09-07 DIAGNOSIS — E538 Deficiency of other specified B group vitamins: Secondary | ICD-10-CM | POA: Diagnosis not present

## 2022-09-07 DIAGNOSIS — Z0000 Encounter for general adult medical examination without abnormal findings: Secondary | ICD-10-CM | POA: Diagnosis not present

## 2022-09-07 DIAGNOSIS — K2270 Barrett's esophagus without dysplasia: Secondary | ICD-10-CM | POA: Diagnosis not present

## 2022-09-07 DIAGNOSIS — E7849 Other hyperlipidemia: Secondary | ICD-10-CM | POA: Diagnosis not present

## 2022-09-07 DIAGNOSIS — I10 Essential (primary) hypertension: Secondary | ICD-10-CM | POA: Diagnosis not present

## 2022-09-07 DIAGNOSIS — E1129 Type 2 diabetes mellitus with other diabetic kidney complication: Secondary | ICD-10-CM | POA: Diagnosis not present

## 2022-09-07 DIAGNOSIS — R809 Proteinuria, unspecified: Secondary | ICD-10-CM | POA: Diagnosis not present

## 2022-09-07 DIAGNOSIS — Z125 Encounter for screening for malignant neoplasm of prostate: Secondary | ICD-10-CM | POA: Diagnosis not present

## 2022-10-03 ENCOUNTER — Other Ambulatory Visit: Payer: Self-pay | Admitting: Surgery

## 2022-10-04 ENCOUNTER — Other Ambulatory Visit: Payer: Self-pay

## 2022-10-04 ENCOUNTER — Encounter
Admission: RE | Admit: 2022-10-04 | Discharge: 2022-10-04 | Disposition: A | Discharge status: Home / Self Care | Payer: PPO | Source: Ambulatory Visit | Attending: Surgery | Admitting: Surgery

## 2022-10-04 DIAGNOSIS — I639 Cerebral infarction, unspecified: Secondary | ICD-10-CM

## 2022-10-04 DIAGNOSIS — E119 Type 2 diabetes mellitus without complications: Secondary | ICD-10-CM

## 2022-10-04 HISTORY — DX: Syncope and collapse: R55

## 2022-10-04 HISTORY — DX: Personal history of other diseases of the digestive system: Z87.19

## 2022-10-04 HISTORY — DX: Acidosis, unspecified: E87.20

## 2022-10-04 HISTORY — DX: Gastro-esophageal reflux disease without esophagitis: K21.9

## 2022-10-04 NOTE — Patient Instructions (Addendum)
Your procedure is scheduled on: 10/12/22 - Wednesday Report to the Registration Desk on the 1st floor of the [facility]. To find out your arrival time, please call [phone] between 1PM - 3PM on: 10/11/22 - Tuesday If your arrival time is 6:00 am, do not arrive before that time as the [facility] entrance doors do not open until 6:00 am.  REMEMBER: Instructions that are not followed completely may result in serious medical risk, up to and including death; or upon the discretion of your surgeon and anesthesiologist your surgery may need to be rescheduled.  Do not eat food after midnight the night before surgery.  No gum chewing or hard candies.  You may drink water up to 2 hours before you are scheduled to arrive for your surgery. Do not drink anything within 2 hours of your scheduled arrival time.   In addition, your doctor has ordered for you to drink the provided:  Gatorade G2 Drinking this carbohydrate drink up to two hours before surgery helps to reduce insulin resistance and improve patient outcomes. Please complete drinking 2 hours before scheduled arrival time.  One week prior to surgery: Stop Anti-inflammatories (NSAIDS) such as Advil, Aleve, Ibuprofen, Motrin, Naproxen, Naprosyn and Aspirin based products such as Excedrin, Goody's Powder, BC Powder. You may however, continue to take Tylenol if needed for pain up until the day of surgery.  Stop ANY OVER THE COUNTER supplements until after surgery.  Continue taking all prescribed medications with the exception of the following:  metFORMIN (GLUCOPHAGE) hold beginning 10/10/22.  TAKE ONLY THESE MEDICATIONS THE MORNING OF SURGERY WITH A SIP OF WATER:  pantoprazole (PROTONIX) - (take one the night before and one on the morning of surgery - helps to prevent nausea after surgery.)   No Alcohol for 24 hours before or after surgery.  No Smoking including e-cigarettes for 24 hours before surgery.  No chewable tobacco products  for at least 6 hours before surgery.  No nicotine patches on the day of surgery.  Do not use any "recreational" drugs for at least a week (preferably 2 weeks) before your surgery.  Please be advised that the combination of cocaine and anesthesia may have negative outcomes, up to and including death. If you test positive for cocaine, your surgery will be cancelled.  On the morning of surgery brush your teeth with toothpaste and water, you may rinse your mouth with mouthwash if you wish. Do not swallow any toothpaste or mouthwash.  Use CHG Soap or wipes as directed on instruction sheet.  Do not wear jewelry, make-up, hairpins, clips or nail polish.  Do not wear lotions, powders, or perfumes.   Do not shave body hair from the neck down 48 hours before surgery.  Contact lenses, hearing aids and dentures may not be worn into surgery.  Do not bring valuables to the hospital. [facility] is not responsible for any missing/lost belongings or valuables.   Notify your doctor if there is any change in your medical condition (cold, fever, infection).  Wear comfortable clothing (specific to your surgery type) to the hospital.  After surgery, you can help prevent lung complications by doing breathing exercises.  Take deep breaths and cough every 1-2 hours. Your doctor may order a device called an Incentive Spirometer to help you take deep breaths. When coughing or sneezing, hold a pillow firmly against your incision with both hands. This is called "splinting." Doing this helps protect your incision. It also decreases belly discomfort.  If you  are being admitted to the hospital overnight, leave your suitcase in the car. After surgery it may be brought to your room.  In case of increased patient census, it may be necessary for you, the patient, to continue your postoperative care in the Same Day Surgery department.  If you are being discharged the day of surgery, you will not be allowed to drive  home. You will need a responsible individual to drive you home and stay with you for 24 hours after surgery.   If you are taking public transportation, you will need to have a responsible individual with you.  Please call the Pre-admissions Testing Dept. at [phone] if you have any questions about these instructions.  Surgery Visitation Policy:  Patients having surgery or a procedure may have two visitors.  Children under the age of 37 must have an adult with them who is not the patient.  Inpatient Visitation:    Visiting hours are 7 a.m. to 8 p.m. Up to four visitors are allowed at one time in a patient room. The visitors may rotate out with other people during the day.  One visitor age 102 or older may stay with the patient overnight and must be in the room by 8 p.m.     Preparing for Surgery with CHLORHEXIDINE GLUCONATE (CHG) Soap  Chlorhexidine Gluconate (CHG) Soap  o An antiseptic cleaner that kills germs and bonds with the skin to continue killing germs even after washing  o Used for showering the night before surgery and morning of surgery  Before surgery, you can play an important role by reducing the number of germs on your skin.  CHG (Chlorhexidine gluconate) soap is an antiseptic cleanser which kills germs and bonds with the skin to continue killing germs even after washing.  Please do not use if you have an allergy to CHG or antibacterial soaps. If your skin becomes reddened/irritated stop using the CHG.  1. Shower the NIGHT BEFORE SURGERY and the MORNING OF SURGERY with CHG soap.  2. If you choose to wash your hair, wash your hair first as usual with your normal shampoo.  3. After shampooing, rinse your hair and body thoroughly to remove the shampoo.  4. Use CHG as you would any other liquid soap. You can apply CHG directly to the skin and wash gently with a scrungie or a clean washcloth.  5. Apply the CHG soap to your body only from the neck down. Do not  use on open wounds or open sores. Avoid contact with your eyes, ears, mouth, and genitals (private parts). Wash face and genitals (private parts) with your normal soap.  6. Wash thoroughly, paying special attention to the area where your surgery will be performed.  7. Thoroughly rinse your body with warm water.  8. Do not shower/wash with your normal soap after using and rinsing off the CHG soap.  9. Pat yourself dry with a clean towel.  10. Wear clean pajamas to bed the night before surgery.  12. Place clean sheets on your bed the night of your first shower and do not sleep with pets.  13. Shower again with the CHG soap on the day of surgery prior to arriving at the hospital.  14. Do not apply any deodorants/lotions/powders.  15. Please wear clean clothes to the hospital.

## 2022-10-05 ENCOUNTER — Encounter: Payer: Self-pay | Admitting: Surgery

## 2022-10-05 ENCOUNTER — Encounter
Admission: RE | Admit: 2022-10-05 | Discharge: 2022-10-05 | Disposition: A | Discharge status: Home / Self Care | Payer: PPO | Source: Ambulatory Visit | Attending: Surgery | Admitting: Surgery

## 2022-10-05 ENCOUNTER — Other Ambulatory Visit: Payer: PPO

## 2022-10-05 DIAGNOSIS — Z01810 Encounter for preprocedural cardiovascular examination: Secondary | ICD-10-CM | POA: Diagnosis not present

## 2022-10-05 DIAGNOSIS — R001 Bradycardia, unspecified: Secondary | ICD-10-CM | POA: Insufficient documentation

## 2022-10-05 DIAGNOSIS — I639 Cerebral infarction, unspecified: Secondary | ICD-10-CM

## 2022-10-05 DIAGNOSIS — M65321 Trigger finger, right index finger: Secondary | ICD-10-CM | POA: Diagnosis not present

## 2022-10-05 DIAGNOSIS — E1142 Type 2 diabetes mellitus with diabetic polyneuropathy: Secondary | ICD-10-CM | POA: Diagnosis not present

## 2022-10-05 DIAGNOSIS — Z01818 Encounter for other preprocedural examination: Secondary | ICD-10-CM | POA: Diagnosis present

## 2022-10-12 ENCOUNTER — Other Ambulatory Visit: Payer: Self-pay

## 2022-10-12 ENCOUNTER — Ambulatory Visit: Payer: PPO

## 2022-10-12 ENCOUNTER — Ambulatory Visit: Payer: PPO | Admitting: Urgent Care

## 2022-10-12 ENCOUNTER — Ambulatory Visit
Admission: RE | Admit: 2022-10-12 | Discharge: 2022-10-12 | Disposition: A | Discharge status: Home / Self Care | Payer: PPO | Attending: Surgery | Admitting: Surgery

## 2022-10-12 ENCOUNTER — Encounter
Admission: RE | Disposition: A | Discharge status: Home / Self Care | Payer: Self-pay | Source: Home / Self Care | Attending: Surgery

## 2022-10-12 ENCOUNTER — Encounter: Payer: Self-pay | Admitting: Surgery

## 2022-10-12 DIAGNOSIS — E119 Type 2 diabetes mellitus without complications: Secondary | ICD-10-CM | POA: Diagnosis not present

## 2022-10-12 DIAGNOSIS — H548 Legal blindness, as defined in USA: Secondary | ICD-10-CM | POA: Insufficient documentation

## 2022-10-12 DIAGNOSIS — M65321 Trigger finger, right index finger: Secondary | ICD-10-CM | POA: Diagnosis not present

## 2022-10-12 DIAGNOSIS — Z8673 Personal history of transient ischemic attack (TIA), and cerebral infarction without residual deficits: Secondary | ICD-10-CM | POA: Insufficient documentation

## 2022-10-12 DIAGNOSIS — I10 Essential (primary) hypertension: Secondary | ICD-10-CM | POA: Diagnosis not present

## 2022-10-12 HISTORY — DX: Unspecified osteoarthritis, unspecified site: M19.90

## 2022-10-12 HISTORY — DX: Unspecified malignant neoplasm of skin, unspecified: C44.90

## 2022-10-12 HISTORY — PX: TRIGGER FINGER RELEASE: SHX641

## 2022-10-12 LAB — GLUCOSE, CAPILLARY
Glucose-Capillary: 110 mg/dL — ABNORMAL HIGH (ref 70–99)
Glucose-Capillary: 131 mg/dL — ABNORMAL HIGH (ref 70–99)

## 2022-10-12 SURGERY — RELEASE, A1 PULLEY, FOR TRIGGER FINGER
Anesthesia: General | Site: Index Finger | Laterality: Right

## 2022-10-12 MED ORDER — ACETAMINOPHEN 325 MG PO TABS: 325.0000 mg | ORAL_TABLET | Freq: Four times a day (QID) | ORAL | Status: DC | PRN

## 2022-10-12 MED ORDER — DEXAMETHASONE SODIUM PHOSPHATE 10 MG/ML IJ SOLN
INTRAMUSCULAR | Status: DC | PRN
  Administered 2022-10-12: 10 mg via INTRAVENOUS

## 2022-10-12 MED ORDER — SODIUM CHLORIDE 0.9 % IV SOLN: INTRAVENOUS | Status: DC

## 2022-10-12 MED ORDER — FENTANYL CITRATE (PF) 100 MCG/2ML IJ SOLN: 25.0000 ug | INTRAMUSCULAR | Status: DC | PRN

## 2022-10-12 MED ORDER — HYDROMORPHONE HCL 1 MG/ML IJ SOLN
INTRAMUSCULAR | Status: AC
  Filled 2022-10-12: qty 1

## 2022-10-12 MED ORDER — 0.9 % SODIUM CHLORIDE (POUR BTL) OPTIME
TOPICAL | Status: DC | PRN
  Administered 2022-10-12: 500 mL

## 2022-10-12 MED ORDER — SUCCINYLCHOLINE CHLORIDE 200 MG/10ML IV SOSY
PREFILLED_SYRINGE | INTRAVENOUS | Status: DC | PRN
Start: 2022-10-12 — End: 2022-10-12
  Administered 2022-10-12: 50 mg via INTRAVENOUS

## 2022-10-12 MED ORDER — CHLORHEXIDINE GLUCONATE 0.12 % MT SOLN
15.0000 mL | Freq: Once | OROMUCOSAL | Status: AC
  Administered 2022-10-12: 15 mL via OROMUCOSAL

## 2022-10-12 MED ORDER — CEFAZOLIN SODIUM-DEXTROSE 2-4 GM/100ML-% IV SOLN
INTRAVENOUS | Status: AC
  Filled 2022-10-12: qty 100

## 2022-10-12 MED ORDER — SEVOFLURANE IN SOLN
RESPIRATORY_TRACT | Status: AC
  Filled 2022-10-12: qty 250

## 2022-10-12 MED ORDER — FENTANYL CITRATE (PF) 100 MCG/2ML IJ SOLN
INTRAMUSCULAR | Status: AC
  Filled 2022-10-12: qty 2

## 2022-10-12 MED ORDER — CEFAZOLIN SODIUM-DEXTROSE 2-4 GM/100ML-% IV SOLN
2.0000 g | INTRAVENOUS | Status: AC
  Administered 2022-10-12: 2 g via INTRAVENOUS

## 2022-10-12 MED ORDER — PROPOFOL 1000 MG/100ML IV EMUL
INTRAVENOUS | Status: AC
  Filled 2022-10-12: qty 100

## 2022-10-12 MED ORDER — LIDOCAINE HCL (PF) 1 % IJ SOLN
INTRAMUSCULAR | Status: AC
  Filled 2022-10-12: qty 30

## 2022-10-12 MED ORDER — MIDAZOLAM HCL 2 MG/2ML IJ SOLN
INTRAMUSCULAR | Status: DC | PRN
  Administered 2022-10-12: 2 mg via INTRAVENOUS

## 2022-10-12 MED ORDER — ONDANSETRON HCL 4 MG/2ML IJ SOLN: 4.0000 mg | Freq: Once | INTRAMUSCULAR | Status: DC | PRN

## 2022-10-12 MED ORDER — HYDROMORPHONE HCL 1 MG/ML IJ SOLN
INTRAMUSCULAR | Status: DC | PRN
  Administered 2022-10-12: .5 mg via INTRAVENOUS

## 2022-10-12 MED ORDER — LIDOCAINE HCL (CARDIAC) PF 100 MG/5ML IV SOSY
PREFILLED_SYRINGE | INTRAVENOUS | Status: DC | PRN
  Administered 2022-10-12: 100 mg via INTRAVENOUS

## 2022-10-12 MED ORDER — BUPIVACAINE HCL (PF) 0.5 % IJ SOLN
INTRAMUSCULAR | Status: DC | PRN
  Administered 2022-10-12: 5 mL

## 2022-10-12 MED ORDER — TRAMADOL HCL 50 MG PO TABS: 50.0000 mg | ORAL_TABLET | Freq: Four times a day (QID) | ORAL | Status: DC | PRN

## 2022-10-12 MED ORDER — PROPOFOL 10 MG/ML IV BOLUS
INTRAVENOUS | Status: DC | PRN
Start: 2022-10-12 — End: 2022-10-12
  Administered 2022-10-12: 100 mg via INTRAVENOUS
  Administered 2022-10-12: 80 mg via INTRAVENOUS

## 2022-10-12 MED ORDER — CHLORHEXIDINE GLUCONATE 0.12 % MT SOLN
OROMUCOSAL | Status: AC
  Filled 2022-10-12: qty 15

## 2022-10-12 MED ORDER — POTASSIUM CHLORIDE IN NACL 20-0.9 MEQ/L-% IV SOLN
INTRAVENOUS | Status: DC
  Filled 2022-10-12 (×3): qty 1000

## 2022-10-12 MED ORDER — FENTANYL CITRATE (PF) 100 MCG/2ML IJ SOLN
INTRAMUSCULAR | Status: DC | PRN
  Administered 2022-10-12 (×2): 50 ug via INTRAVENOUS

## 2022-10-12 MED ORDER — MIDAZOLAM HCL 2 MG/2ML IJ SOLN
INTRAMUSCULAR | Status: AC
  Filled 2022-10-12: qty 2

## 2022-10-12 MED ORDER — ORAL CARE MOUTH RINSE: 15.0000 mL | Freq: Once | OROMUCOSAL | Status: AC

## 2022-10-12 MED ORDER — BUPIVACAINE HCL (PF) 0.5 % IJ SOLN
INTRAMUSCULAR | Status: AC
  Filled 2022-10-12: qty 30

## 2022-10-12 MED ORDER — LACTATED RINGERS IV SOLN: INTRAVENOUS | Status: DC

## 2022-10-12 MED ORDER — METOCLOPRAMIDE HCL 10 MG PO TABS: 5.0000 mg | ORAL_TABLET | Freq: Three times a day (TID) | ORAL | Status: DC | PRN

## 2022-10-12 MED ORDER — ONDANSETRON HCL 4 MG PO TABS: 4.0000 mg | ORAL_TABLET | Freq: Four times a day (QID) | ORAL | Status: DC | PRN

## 2022-10-12 MED ORDER — METOCLOPRAMIDE HCL 5 MG/ML IJ SOLN: 5.0000 mg | Freq: Three times a day (TID) | INTRAMUSCULAR | Status: DC | PRN

## 2022-10-12 MED ORDER — ONDANSETRON HCL 4 MG/2ML IJ SOLN: 4.0000 mg | Freq: Four times a day (QID) | INTRAMUSCULAR | Status: DC | PRN

## 2022-10-12 MED ORDER — ONDANSETRON HCL 4 MG/2ML IJ SOLN
INTRAMUSCULAR | Status: AC
  Filled 2022-10-12: qty 2

## 2022-10-12 MED ORDER — ACETAMINOPHEN 10 MG/ML IV SOLN: 1000.0000 mg | Freq: Once | INTRAVENOUS | Status: DC | PRN

## 2022-10-12 MED ORDER — LIDOCAINE HCL (PF) 2 % IJ SOLN
INTRAMUSCULAR | Status: AC
  Filled 2022-10-12: qty 5

## 2022-10-12 MED ORDER — ONDANSETRON HCL 4 MG/2ML IJ SOLN
INTRAMUSCULAR | Status: DC | PRN
Start: 2022-10-12 — End: 2022-10-12
  Administered 2022-10-12: 4 mg via INTRAVENOUS

## 2022-10-12 MED ORDER — DEXAMETHASONE SODIUM PHOSPHATE 10 MG/ML IJ SOLN
INTRAMUSCULAR | Status: AC
  Filled 2022-10-12: qty 1

## 2022-10-12 MED ORDER — EPHEDRINE SULFATE (PRESSORS) 50 MG/ML IJ SOLN
INTRAMUSCULAR | Status: DC | PRN
  Administered 2022-10-12: 10 mg via INTRAVENOUS

## 2022-10-12 SURGICAL SUPPLY — 47 items
APL PRP STRL LF DISP 70% ISPRP (MISCELLANEOUS) ×1
BLADE OSC/SAGITTAL 5.5X25 (BLADE) ×1 IMPLANT
BNDG CMPR 5X4 CHSV STRCH STRL (GAUZE/BANDAGES/DRESSINGS) ×1
BNDG COHESIVE 4X5 TAN STRL LF (GAUZE/BANDAGES/DRESSINGS) ×1 IMPLANT
BNDG ESMARCH 4 X 12 STRL LF (GAUZE/BANDAGES/DRESSINGS) ×1
BNDG ESMARCH 4X12 STRL LF (GAUZE/BANDAGES/DRESSINGS) ×1 IMPLANT
BNDG GZE 12X3 1 PLY HI ABS (GAUZE/BANDAGES/DRESSINGS) ×1
BNDG STRETCH GAUZE 3IN X12FT (GAUZE/BANDAGES/DRESSINGS) ×1 IMPLANT
BUR 4X55 1 (BURR) ×1 IMPLANT
BUR SURG RND 3 LG (BURR) ×1 IMPLANT
CHLORAPREP W/TINT 26 (MISCELLANEOUS) ×1 IMPLANT
CORD BIP STRL DISP 12FT (MISCELLANEOUS) IMPLANT
CUFF TOURN SGL QUICK 18X4 (TOURNIQUET CUFF) IMPLANT
DRAPE FLUOR MINI C-ARM 54X84 (DRAPES) ×1 IMPLANT
FORCEPS JEWEL BIP 4-3/4 STR (INSTRUMENTS) IMPLANT
GAUZE SPONGE 4X4 12PLY STRL (GAUZE/BANDAGES/DRESSINGS) ×1 IMPLANT
GAUZE XEROFORM 1X8 LF (GAUZE/BANDAGES/DRESSINGS) ×1 IMPLANT
GLOVE BIO SURGEON STRL SZ8 (GLOVE) ×2 IMPLANT
GLOVE INDICATOR 8.0 STRL GRN (GLOVE) ×1 IMPLANT
GOWN STRL REUS W/ TWL LRG LVL3 (GOWN DISPOSABLE) ×1 IMPLANT
GOWN STRL REUS W/ TWL XL LVL3 (GOWN DISPOSABLE) ×1 IMPLANT
GOWN STRL REUS W/TWL LRG LVL3 (GOWN DISPOSABLE) ×1
GOWN STRL REUS W/TWL XL LVL3 (GOWN DISPOSABLE) ×1
KIT TURNOVER KIT A (KITS) ×1 IMPLANT
MANIFOLD NEPTUNE II (INSTRUMENTS) ×1 IMPLANT
NDL HYPO 25X1 1.5 SAFETY (NEEDLE) ×1 IMPLANT
NEEDLE HYPO 25X1 1.5 SAFETY (NEEDLE) ×1
NS IRRIG 500ML POUR BTL (IV SOLUTION) ×1 IMPLANT
PACK EXTREMITY ARMC (MISCELLANEOUS) ×1 IMPLANT
PASSER SUT SWANSON 36MM LOOP (INSTRUMENTS) ×1 IMPLANT
SPLINT WRIST LG RT TX900304 (SOFTGOODS) IMPLANT
SPLINT WRIST M LT TX990308 (SOFTGOODS) ×1 IMPLANT
STOCKINETTE 48X4 2 PLY STRL (GAUZE/BANDAGES/DRESSINGS) ×1 IMPLANT
STOCKINETTE IMPERV 14X48 (MISCELLANEOUS) ×1 IMPLANT
STOCKINETTE IMPERVIOUS 9X36 MD (GAUZE/BANDAGES/DRESSINGS) IMPLANT
STOCKINETTE STRL 4IN 9604848 (GAUZE/BANDAGES/DRESSINGS) ×1
SUT ETHIBOND 3-0 EXTR (SUTURE) IMPLANT
SUT PROLENE 1 CT (SUTURE) IMPLANT
SUT PROLENE 4 0 PS 2 18 (SUTURE) IMPLANT
SUT VIC AB 0 CT2 27 (SUTURE) IMPLANT
SUT VIC AB 2-0 SH 27 (SUTURE)
SUT VIC AB 2-0 SH 27XBRD (SUTURE) IMPLANT
SUT VICRYL 5-0 (SUTURE) IMPLANT
SYR 10ML LL (SYRINGE) ×1 IMPLANT
TRAP FLUID SMOKE EVACUATOR (MISCELLANEOUS) ×1 IMPLANT
WATER STERILE IRR 500ML POUR (IV SOLUTION) ×1 IMPLANT
WIRE Z .062 C-WIRE SPADE TIP (WIRE) ×1 IMPLANT

## 2022-10-12 NOTE — Op Note (Signed)
10/12/2022  9:17 AM  Patient:   Ronald Trujillo  Pre-Op Diagnosis:   Right index trigger finger.  Post-Op Diagnosis:   Same  Procedure:   Release right index trigger finger.  Surgeon:   Maryagnes Amos, MD  Assistant:   None  Anesthesia:   GET  Findings:   As above.  Complications:   None  EBL:   0 cc  Fluids:   600 cc crystalloid  TT:   12 minutes at 250 mmHg  Drains:   None  Closure:   4-0 Prolene  Implants:   None  Brief Clinical Note:   The patient is a 62 year old male with a history of persistent painful catching of his right index finger. These symptoms have progressed despite medications, activity modification, etc. The patient's history and examination were consistent with a right index trigger finger. The patient presents at this time for a right index trigger finger release.  Procedure:   The patient was brought into the operating room and lain in the supine position. After adequate general endotracheal intubation and anesthesia were obtained, the patient's right hand and upper extremity were prepped with ChloraPrep solution before being draped sterilely. Preoperative antibiotics were administered.  A timeout was performed to verify the appropriate surgical site.  The limb was exsanguinated with an Esmarch and the tourniquet inflated to 250 mmHg.  An approximately 1.5-2.0 cm incision was made over the volar aspect of the index finger at the level of the metacarpal head centered over the flexor sheath. The incision was carried down through the subcutaneous tissues with care taken to identify and protect the digital neurovascular structures. The flexor sheath was entered just proximal to the A1 pulley. The sheath was released proximally for several centimeters under direct visualization. Distally, a clamp was placed beneath the A1 pulley and used to release any adhesions. The clamp was repositioned so that one jaw was superficial to and the other jaw deep to the A1  pulley. The A1 pulley was incised on either side of the clamp to remove a 2 mm strip of tissue. Metzenbaum scissors were used to ensure complete release of the A1 pulley more distally. The underlying tendons were carefully inspected and found to be intact.   The wound was copiously irrigated with sterile saline solution before the wound was closed using 4-0 Prolene interrupted sutures. A total of 10 cc of 0.5% plain Sensorcaine was injected in and around the incision to help with postoperative analgesia before a sterile bulky dressing was applied to the hand. The patient was then awakened and returned to the recovery room in satisfactory condition after tolerating the procedure well.

## 2022-10-12 NOTE — Transfer of Care (Signed)
Immediate Anesthesia Transfer of Care Note  Patient: Ronald Trujillo  Procedure(s) Performed: RELEASE OF RIGHT INDEX TRIGGER FINGER (Right: Index Finger)  Patient Location: PACU  Anesthesia Type:General  Level of Consciousness: drowsy  Airway & Oxygen Therapy: Patient Spontanous Breathing and Patient connected to face mask oxygen  Post-op Assessment: Report given to RN and Post -op Vital signs reviewed and stable  Post vital signs: Reviewed and stable  Last Vitals:  Vitals Value Taken Time  BP 133/73 10/12/22 0917  Temp 38F   Pulse 90 10/12/22 0921  Resp 14 10/12/22 0921  SpO2 100 % 10/12/22 0921  Vitals shown include unfiled device data.  Last Pain:  Vitals:   10/12/22 0734  TempSrc: Temporal  PainSc: 0-No pain         Complications: No notable events documented.

## 2022-10-12 NOTE — Anesthesia Procedure Notes (Cosign Needed)
Procedure Name: Intubation Date/Time: 10/12/2022 8:35 AM  Performed by: Reed Breech, MDPre-anesthesia Checklist: Patient identified, Emergency Drugs available, Suction available and Patient being monitored Patient Re-evaluated:Patient Re-evaluated prior to induction Oxygen Delivery Method: Circle system utilized Preoxygenation: Pre-oxygenation with 100% oxygen Induction Type: IV induction Ventilation: Mask ventilation without difficulty Laryngoscope Size: McGraph and 3 Grade View: Grade I Tube type: Oral Tube size: 7.0 mm Number of attempts: 1 Airway Equipment and Method: Stylet Placement Confirmation: ETT inserted through vocal cords under direct vision, positive ETCO2 and breath sounds checked- equal and bilateral Secured at: 22 cm Tube secured with: Tape Dental Injury: Teeth and Oropharynx as per pre-operative assessment  Comments: Attempted to place size 4 LMA, unable to get a seal. Transitioned to ETT.

## 2022-10-12 NOTE — Discharge Instructions (Addendum)
Orthopedic discharge instructions: Keep dressing dry and intact. Keep hand elevated above heart level. May shower after dressing removed on postop day 4 (Sunday). Cover sutures with Band-Aids after drying off. Apply ice to affected area frequently. Take Aleve 1-2 tabs BID OR ibuprofen 600-800 mg TID with meals for 3-5 days, then as necessary. Take ES Tylenol or pain medication as prescribed when needed.  Resume Plavix on Thursday morning, 10/13/2022. Return for follow-up in 10-14 days or as scheduled.   AMBULATORY SURGERY  DISCHARGE INSTRUCTIONS   The drugs that you were given will stay in your system until tomorrow so for the next 24 hours you should not:  Drive an automobile Make any legal decisions Drink any alcoholic beverage   You may resume regular meals tomorrow.  Today it is better to start with liquids and gradually work up to solid foods.  You may eat anything you prefer, but it is better to start with liquids, then soup and crackers, and gradually work up to solid foods.   Please notify your doctor immediately if you have any unusual bleeding, trouble breathing, redness and pain at the surgery site, drainage, fever, or pain not relieved by medication.    Additional Instructions:        Please contact your physician with any problems or Same Day Surgery at [phone], Monday through Friday 6 am to 4 pm, or [place] [facility] number at [phone].
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
## 2022-10-12 NOTE — Anesthesia Postprocedure Evaluation (Signed)
Anesthesia Post Note  Patient: Ronald Trujillo  Procedure(s) Performed: RELEASE OF RIGHT INDEX TRIGGER FINGER (Right: Index Finger)  Patient location during evaluation: PACU Anesthesia Type: General Level of consciousness: awake and alert, oriented and patient cooperative Pain management: pain level controlled Vital Signs Assessment: post-procedure vital signs reviewed and stable Respiratory status: spontaneous breathing, nonlabored ventilation and respiratory function stable Cardiovascular status: blood pressure returned to baseline and stable Postop Assessment: adequate PO intake Anesthetic complications: no   No notable events documented.   Last Vitals:  Vitals:   10/12/22 1000 10/12/22 1012  BP: 135/74 130/82  Pulse: (!) 53 64  Resp: 16 18  Temp: 36.4 C   SpO2: 99% 93%    Last Pain:  Vitals:   10/12/22 1012  TempSrc:   PainSc: 0-No pain                 Reed Breech

## 2022-10-12 NOTE — Anesthesia Preprocedure Evaluation (Addendum)
Anesthesia Evaluation  Patient identified by MRN, date of birth, ID band Patient awake    Reviewed: Allergy & Precautions, NPO status , Patient's Chart, lab work & pertinent test results  History of Anesthesia Complications Negative for: history of anesthetic complications  Airway Mallampati: III   Neck ROM: Full    Dental  (+) Missing   Pulmonary neg pulmonary ROS   Pulmonary exam normal breath sounds clear to auscultation       Cardiovascular hypertension, Normal cardiovascular exam Rhythm:Regular Rate:Normal  ECG 10/05/22:  Sinus bradycardia (HR 45) Otherwise normal ECG When compared with ECG of 10-Feb-2020 10:15, No significant change was found   Neuro/Psych  Headaches Legally blind CVA (2009, 2011 on Plavix; no residual deficits)    GI/Hepatic negative GI ROS,,,  Endo/Other  diabetes, Type 2    Renal/GU Renal disease (nephrolithiasis)     Musculoskeletal  (+) Arthritis ,    Abdominal   Peds  Hematology  (+) Blood dyscrasia, anemia   Anesthesia Other Findings   Reproductive/Obstetrics                             Anesthesia Physical Anesthesia Plan  ASA: 3  Anesthesia Plan: General   Post-op Pain Management:    Induction: Intravenous  PONV Risk Score and Plan: 2 and Ondansetron, Dexamethasone and Treatment may vary due to age or medical condition  Airway Management Planned: LMA  Additional Equipment:   Intra-op Plan:   Post-operative Plan: Extubation in OR  Informed Consent: I have reviewed the patients History and Physical, chart, labs and discussed the procedure including the risks, benefits and alternatives for the proposed anesthesia with the patient or authorized representative who has indicated his/her understanding and acceptance.     Dental advisory given  Plan Discussed with: CRNA  Anesthesia Plan Comments: (Patient consented for risks of anesthesia  including but not limited to:  - adverse reactions to medications - damage to eyes, teeth, lips or other oral mucosa - nerve damage due to positioning  - sore throat or hoarseness - damage to heart, brain, nerves, lungs, other parts of body or loss of life  Informed patient about role of CRNA in peri- and intra-operative care.  Patient voiced understanding.)       Anesthesia Quick Evaluation

## 2022-10-13 ENCOUNTER — Encounter: Payer: Self-pay | Admitting: Surgery

## 2022-11-14 DIAGNOSIS — G8929 Other chronic pain: Secondary | ICD-10-CM | POA: Diagnosis not present

## 2022-11-14 DIAGNOSIS — E1129 Type 2 diabetes mellitus with other diabetic kidney complication: Secondary | ICD-10-CM | POA: Diagnosis not present

## 2022-11-14 DIAGNOSIS — E538 Deficiency of other specified B group vitamins: Secondary | ICD-10-CM | POA: Diagnosis not present

## 2022-11-14 DIAGNOSIS — I10 Essential (primary) hypertension: Secondary | ICD-10-CM | POA: Diagnosis not present

## 2022-11-14 DIAGNOSIS — M19012 Primary osteoarthritis, left shoulder: Secondary | ICD-10-CM | POA: Diagnosis not present

## 2022-11-14 DIAGNOSIS — Y92009 Unspecified place in unspecified non-institutional (private) residence as the place of occurrence of the external cause: Secondary | ICD-10-CM | POA: Diagnosis not present

## 2022-11-14 DIAGNOSIS — M25512 Pain in left shoulder: Secondary | ICD-10-CM | POA: Diagnosis not present

## 2022-11-14 DIAGNOSIS — W010XXA Fall on same level from slipping, tripping and stumbling without subsequent striking against object, initial encounter: Secondary | ICD-10-CM | POA: Diagnosis not present

## 2022-11-14 DIAGNOSIS — R2689 Other abnormalities of gait and mobility: Secondary | ICD-10-CM | POA: Diagnosis not present

## 2022-11-14 DIAGNOSIS — R809 Proteinuria, unspecified: Secondary | ICD-10-CM | POA: Diagnosis not present

## 2022-11-14 DIAGNOSIS — M519 Unspecified thoracic, thoracolumbar and lumbosacral intervertebral disc disorder: Secondary | ICD-10-CM | POA: Diagnosis not present

## 2022-11-17 DIAGNOSIS — L918 Other hypertrophic disorders of the skin: Secondary | ICD-10-CM | POA: Diagnosis not present

## 2022-11-17 DIAGNOSIS — L578 Other skin changes due to chronic exposure to nonionizing radiation: Secondary | ICD-10-CM | POA: Diagnosis not present

## 2022-11-17 DIAGNOSIS — Z872 Personal history of diseases of the skin and subcutaneous tissue: Secondary | ICD-10-CM | POA: Diagnosis not present

## 2022-11-17 DIAGNOSIS — Z86018 Personal history of other benign neoplasm: Secondary | ICD-10-CM | POA: Diagnosis not present

## 2022-11-17 DIAGNOSIS — L2089 Other atopic dermatitis: Secondary | ICD-10-CM | POA: Diagnosis not present

## 2022-11-25 DIAGNOSIS — M65321 Trigger finger, right index finger: Secondary | ICD-10-CM | POA: Diagnosis not present

## 2022-11-25 DIAGNOSIS — M7582 Other shoulder lesions, left shoulder: Secondary | ICD-10-CM | POA: Diagnosis not present

## 2022-12-14 DIAGNOSIS — H47013 Ischemic optic neuropathy, bilateral: Secondary | ICD-10-CM | POA: Diagnosis not present

## 2022-12-14 DIAGNOSIS — E119 Type 2 diabetes mellitus without complications: Secondary | ICD-10-CM | POA: Diagnosis not present

## 2022-12-14 DIAGNOSIS — H2513 Age-related nuclear cataract, bilateral: Secondary | ICD-10-CM | POA: Diagnosis not present

## 2022-12-15 DIAGNOSIS — Q180 Sinus, fistula and cyst of branchial cleft: Secondary | ICD-10-CM | POA: Diagnosis not present

## 2022-12-15 DIAGNOSIS — R519 Headache, unspecified: Secondary | ICD-10-CM | POA: Diagnosis not present

## 2022-12-15 DIAGNOSIS — K219 Gastro-esophageal reflux disease without esophagitis: Secondary | ICD-10-CM | POA: Diagnosis not present

## 2022-12-15 DIAGNOSIS — H6063 Unspecified chronic otitis externa, bilateral: Secondary | ICD-10-CM | POA: Diagnosis not present

## 2022-12-15 DIAGNOSIS — R42 Dizziness and giddiness: Secondary | ICD-10-CM | POA: Diagnosis not present

## 2022-12-28 ENCOUNTER — Other Ambulatory Visit: Payer: Self-pay | Admitting: Otolaryngology

## 2022-12-28 DIAGNOSIS — R42 Dizziness and giddiness: Secondary | ICD-10-CM

## 2023-01-03 ENCOUNTER — Ambulatory Visit
Admission: RE | Admit: 2023-01-03 | Discharge: 2023-01-03 | Disposition: A | Discharge status: Home / Self Care | Payer: PPO | Source: Ambulatory Visit | Attending: Otolaryngology | Admitting: Otolaryngology

## 2023-01-03 DIAGNOSIS — R42 Dizziness and giddiness: Secondary | ICD-10-CM | POA: Diagnosis not present

## 2023-01-06 ENCOUNTER — Other Ambulatory Visit: Payer: Self-pay | Admitting: Otolaryngology

## 2023-01-06 DIAGNOSIS — B351 Tinea unguium: Secondary | ICD-10-CM | POA: Diagnosis not present

## 2023-01-06 DIAGNOSIS — E1142 Type 2 diabetes mellitus with diabetic polyneuropathy: Secondary | ICD-10-CM | POA: Diagnosis not present

## 2023-01-06 DIAGNOSIS — R42 Dizziness and giddiness: Secondary | ICD-10-CM

## 2023-01-09 ENCOUNTER — Other Ambulatory Visit: Payer: Self-pay | Admitting: Surgery

## 2023-01-09 DIAGNOSIS — M7582 Other shoulder lesions, left shoulder: Secondary | ICD-10-CM | POA: Diagnosis not present

## 2023-01-17 ENCOUNTER — Ambulatory Visit: Payer: PPO

## 2023-01-25 ENCOUNTER — Other Ambulatory Visit: Payer: PPO

## 2023-01-27 ENCOUNTER — Ambulatory Visit
Admission: RE | Admit: 2023-01-27 | Discharge: 2023-01-27 | Disposition: A | Discharge status: Home / Self Care | Payer: PPO | Source: Ambulatory Visit | Attending: Surgery | Admitting: Surgery

## 2023-01-27 ENCOUNTER — Ambulatory Visit
Admission: RE | Admit: 2023-01-27 | Discharge: 2023-01-27 | Disposition: A | Discharge status: Home / Self Care | Payer: PPO | Source: Ambulatory Visit | Attending: Otolaryngology | Admitting: Otolaryngology

## 2023-01-27 DIAGNOSIS — M25512 Pain in left shoulder: Secondary | ICD-10-CM | POA: Diagnosis not present

## 2023-01-27 DIAGNOSIS — M19012 Primary osteoarthritis, left shoulder: Secondary | ICD-10-CM | POA: Diagnosis not present

## 2023-01-27 DIAGNOSIS — R42 Dizziness and giddiness: Secondary | ICD-10-CM | POA: Diagnosis not present

## 2023-01-27 DIAGNOSIS — S46012A Strain of muscle(s) and tendon(s) of the rotator cuff of left shoulder, initial encounter: Secondary | ICD-10-CM | POA: Diagnosis not present

## 2023-01-27 DIAGNOSIS — M7582 Other shoulder lesions, left shoulder: Secondary | ICD-10-CM

## 2023-01-27 MED ORDER — GADOPICLENOL 0.5 MMOL/ML IV SOLN
7.5000 mL | Freq: Once | INTRAVENOUS | Status: AC | PRN
  Administered 2023-01-27: 7.5 mL via INTRAVENOUS

## 2023-01-31 ENCOUNTER — Ambulatory Visit: Payer: PPO | Attending: Otolaryngology

## 2023-01-31 DIAGNOSIS — R42 Dizziness and giddiness: Secondary | ICD-10-CM | POA: Diagnosis not present

## 2023-01-31 DIAGNOSIS — R2681 Unsteadiness on feet: Secondary | ICD-10-CM
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
## 2023-02-06 DIAGNOSIS — M7522 Bicipital tendinitis, left shoulder: Secondary | ICD-10-CM | POA: Diagnosis not present

## 2023-02-06 DIAGNOSIS — M7582 Other shoulder lesions, left shoulder: Secondary | ICD-10-CM | POA: Diagnosis not present

## 2023-02-07 ENCOUNTER — Other Ambulatory Visit: Payer: Self-pay | Admitting: Otolaryngology

## 2023-02-07 ENCOUNTER — Ambulatory Visit: Payer: PPO | Attending: Otolaryngology

## 2023-02-07 DIAGNOSIS — R2681 Unsteadiness on feet: Secondary | ICD-10-CM | POA: Diagnosis not present

## 2023-02-07 DIAGNOSIS — R42 Dizziness and giddiness: Secondary | ICD-10-CM | POA: Insufficient documentation
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
## 2023-02-14 ENCOUNTER — Ambulatory Visit: Payer: PPO

## 2023-02-14 DIAGNOSIS — R2681 Unsteadiness on feet: Secondary | ICD-10-CM

## 2023-02-14 DIAGNOSIS — R42 Dizziness and giddiness: Secondary | ICD-10-CM
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
## 2023-02-21 ENCOUNTER — Ambulatory Visit: Payer: PPO

## 2023-02-23 DIAGNOSIS — G43711 Chronic migraine without aura, intractable, with status migrainosus: Secondary | ICD-10-CM | POA: Diagnosis not present

## 2023-02-23 DIAGNOSIS — R42 Dizziness and giddiness: Secondary | ICD-10-CM | POA: Diagnosis not present

## 2023-02-23 DIAGNOSIS — Z8673 Personal history of transient ischemic attack (TIA), and cerebral infarction without residual deficits: Secondary | ICD-10-CM | POA: Diagnosis not present

## 2023-03-02 ENCOUNTER — Other Ambulatory Visit: Payer: Self-pay | Admitting: Surgery

## 2023-03-07 ENCOUNTER — Ambulatory Visit: Payer: PPO

## 2023-03-07 DIAGNOSIS — R42 Dizziness and giddiness: Secondary | ICD-10-CM

## 2023-03-07 DIAGNOSIS — R2681 Unsteadiness on feet: Secondary | ICD-10-CM
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
## 2023-03-13 ENCOUNTER — Ambulatory Visit: Payer: PPO | Attending: Otolaryngology | Admitting: Physical Therapy

## 2023-03-13 DIAGNOSIS — R2681 Unsteadiness on feet: Secondary | ICD-10-CM | POA: Diagnosis not present

## 2023-03-13 DIAGNOSIS — Z01812 Encounter for preprocedural laboratory examination: Secondary | ICD-10-CM | POA: Diagnosis not present

## 2023-03-13 DIAGNOSIS — I6389 Other cerebral infarction: Secondary | ICD-10-CM | POA: Insufficient documentation

## 2023-03-13 DIAGNOSIS — R42 Dizziness and giddiness: Secondary | ICD-10-CM | POA: Insufficient documentation

## 2023-03-13 DIAGNOSIS — E119 Type 2 diabetes mellitus without complications: Secondary | ICD-10-CM | POA: Insufficient documentation
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
## 2023-03-14 ENCOUNTER — Encounter
Admission: RE | Admit: 2023-03-14 | Discharge: 2023-03-14 | Disposition: A | Discharge status: Home / Self Care | Payer: PPO | Source: Ambulatory Visit | Attending: Surgery | Admitting: Surgery

## 2023-03-14 ENCOUNTER — Other Ambulatory Visit: Payer: Self-pay

## 2023-03-14 VITALS — Ht 69.0 in | Wt 189.0 lb

## 2023-03-14 DIAGNOSIS — I6389 Other cerebral infarction: Secondary | ICD-10-CM

## 2023-03-14 DIAGNOSIS — Z01812 Encounter for preprocedural laboratory examination: Secondary | ICD-10-CM

## 2023-03-14 DIAGNOSIS — E119 Type 2 diabetes mellitus without complications: Secondary | ICD-10-CM

## 2023-03-14 NOTE — Patient Instructions (Addendum)
 Your procedure is scheduled on: Jan  15/2025 Wednesday  Report to the Registration Desk on the 1st floor of the [facility]. To find out your arrival time, please call [phone] between 1PM - 3PM on: Jan. 14, 2025 Tuesday If your arrival time is 6:00 am, do not arrive before that time as the [facility] entrance doors do not open until 6:00 am.  REMEMBER: Instructions that are not followed completely may result in serious medical risk, up to and including death; or upon the discretion of your surgeon and anesthesiologist your surgery may need to be rescheduled.  Do not eat food after midnight the night before surgery.  No gum chewing or hard candies.  You may however, drink CLEAR liquids up to 2 hours before you are scheduled to arrive for your surgery. Do not drink anything within 2 hours of your scheduled arrival time.  Clear liquids include: - water   In addition, your doctor has ordered for you to drink the provided:  Ensure Pre-Surgery Clear Carbohydrate Drink   Drinking this carbohydrate drink up to two hours before surgery helps to reduce insulin  resistance and improve patient outcomes. Please complete drinking 2 hours before scheduled arrival time.  One week prior to surgery: Stop Anti-inflammatories (NSAIDS) such as Advil, Aleve, Ibuprofen, Motrin, Naproxen, Naprosyn and Aspirin based products such as Excedrin, Goody's Powder, BC Powder. Stop ANY OVER THE COUNTER supplements until after surgery.  You may however, continue to take Tylenol  if needed for pain up until the day of surgery.   **Follow recommendations regarding stopping blood thinners.**  Ask dr. Klien wether he wants to you stop or continue Plavix.  Stop metfrmin two days before surgery. Last dose of metformin is Jan 02/2024 Sunday  Continue taking all of your other prescription medications up until the day of surgery.  ON THE DAY OF SURGERY ONLY TAKE THESE MEDICATIONS WITH SIPS OF WATER:  pantoprazole  (PROTONIX)  No Alcohol  for 24 hours before or after surgery.  No Smoking including e-cigarettes for 24 hours before surgery.  No chewable tobacco products for at least 6 hours before surgery.  No nicotine patches on the day of surgery.  Do not use any recreational drugs for at least a week (preferably 2 weeks) before your surgery.  Please be advised that the combination of cocaine and anesthesia may have negative outcomes, up to and including death. If you test positive for cocaine, your surgery will be cancelled.  On the morning of surgery brush your teeth with toothpaste and water, you may rinse your mouth with mouthwash if you wish. Do not swallow any toothpaste or mouthwash.  Use CHG Soap or wipes as directed on instruction sheet.-provided for you  Do not wear jewelry, make-up, hairpins, clips or nail polish.  For welded (permanent) jewelry: bracelets, anklets, waist bands, etc.  Please have this removed prior to surgery.  If it is not removed, there is a chance that hospital personnel will need to cut it off on the day of surgery.  Do not wear lotions, powders, or perfumes.   Do not shave body hair from the neck down 48 hours before surgery.  Contact lenses, hearing aids and dentures may not be worn into surgery.  Do not bring valuables to the hospital. [facility] is not responsible for any missing/lost belongings or valuables.     Notify your doctor if there is any change in your medical condition (cold, fever, infection).  Wear comfortable clothing (specific to your surgery  type) to the hospital.  After surgery, you can help prevent lung complications by doing breathing exercises.  Take deep breaths and cough every 1-2 hours. Your doctor may order a device called an Incentive Spirometer to help you take deep breaths. If you are being admitted to the hospital overnight, leave your suitcase in the car. After surgery it may be brought to your room.   If you are being  discharged the day of surgery, you will not be allowed to drive home. You will need a responsible individual to drive you home and stay with you for 24 hours after surgery.    Please call the Pre-admissions Testing Dept. at [phone] if you have any questions about these instructions.  Surgery Visitation Policy:  Patients having surgery or a procedure may have two visitors.  Children under the age of 99 must have an adult with them who is not the patient.

## 2023-03-20 ENCOUNTER — Encounter
Admission: RE | Admit: 2023-03-20 | Discharge: 2023-03-20 | Disposition: A | Discharge status: Home / Self Care | Payer: PPO | Source: Ambulatory Visit | Attending: Surgery | Admitting: Surgery

## 2023-03-20 ENCOUNTER — Ambulatory Visit: Payer: PPO | Admitting: Physical Therapy

## 2023-03-20 DIAGNOSIS — R42 Dizziness and giddiness: Secondary | ICD-10-CM | POA: Diagnosis not present

## 2023-03-20 DIAGNOSIS — I6389 Other cerebral infarction: Secondary | ICD-10-CM | POA: Insufficient documentation

## 2023-03-20 DIAGNOSIS — E119 Type 2 diabetes mellitus without complications: Secondary | ICD-10-CM | POA: Insufficient documentation

## 2023-03-20 DIAGNOSIS — Z01812 Encounter for preprocedural laboratory examination: Secondary | ICD-10-CM | POA: Insufficient documentation

## 2023-03-20 DIAGNOSIS — R2681 Unsteadiness on feet: Secondary | ICD-10-CM

## 2023-03-20 LAB — BASIC METABOLIC PANEL
Anion gap: 11 (ref 5–15)
BUN: 14 mg/dL (ref 8–23)
CO2: 24 mmol/L (ref 22–32)
Calcium: 9 mg/dL (ref 8.9–10.3)
Chloride: 103 mmol/L (ref 98–111)
Creatinine, Ser: 1 mg/dL (ref 0.61–1.24)
GFR, Estimated: >60 mL/min (ref >60)
Glucose, Bld: 100 mg/dL — ABNORMAL HIGH (ref 70–99)
Potassium: 4.3 mmol/L (ref 3.5–5.1)
Sodium: 138 mmol/L (ref 135–145)

## 2023-03-20 LAB — CBC
HCT: 40.1 % (ref 39.0–52.0)
Hemoglobin: 13.2 g/dL (ref 13.0–17.0)
MCH: 27.9 pg (ref 26.0–34.0)
MCHC: 32.9 g/dL (ref 30.0–36.0)
MCV: 84.8 fL (ref 80.0–100.0)
Platelets: 256 10*3/uL (ref 150–400)
RBC: 4.73 MIL/uL (ref 4.22–5.81)
RDW: 13.1 % (ref 11.5–15.5)
WBC: 9.1 10*3/uL (ref 4.0–10.5)
nRBC: 0 % (ref 0.0–0.2)
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
## 2023-03-21 DIAGNOSIS — M7582 Other shoulder lesions, left shoulder: Secondary | ICD-10-CM | POA: Diagnosis not present

## 2023-03-21 MED ORDER — CHLORHEXIDINE GLUCONATE 0.12 % MT SOLN
15.0000 mL | Freq: Once | OROMUCOSAL | Status: AC
  Administered 2023-03-22: 15 mL via OROMUCOSAL

## 2023-03-21 MED ORDER — ORAL CARE MOUTH RINSE: 15.0000 mL | Freq: Once | OROMUCOSAL | Status: AC

## 2023-03-21 MED ORDER — SODIUM CHLORIDE 0.9 % IV SOLN: INTRAVENOUS | Status: DC

## 2023-03-21 MED ORDER — CEFAZOLIN SODIUM-DEXTROSE 2-4 GM/100ML-% IV SOLN
2.0000 g | INTRAVENOUS | Status: AC
  Administered 2023-03-22: 2 g via INTRAVENOUS

## 2023-03-22 ENCOUNTER — Ambulatory Visit: Payer: PPO | Admitting: Urgent Care

## 2023-03-22 ENCOUNTER — Encounter: Payer: Self-pay | Admitting: Surgery

## 2023-03-22 ENCOUNTER — Ambulatory Visit
Admission: RE | Admit: 2023-03-22 | Discharge: 2023-03-22 | Disposition: A | Discharge status: Home / Self Care | Payer: PPO | Attending: Surgery | Admitting: Surgery

## 2023-03-22 ENCOUNTER — Other Ambulatory Visit: Payer: Self-pay

## 2023-03-22 ENCOUNTER — Ambulatory Visit: Payer: PPO

## 2023-03-22 ENCOUNTER — Encounter
Admission: RE | Disposition: A | Discharge status: Home / Self Care | Payer: Self-pay | Source: Home / Self Care | Attending: Surgery

## 2023-03-22 DIAGNOSIS — I10 Essential (primary) hypertension: Secondary | ICD-10-CM | POA: Insufficient documentation

## 2023-03-22 DIAGNOSIS — X58XXXA Exposure to other specified factors, initial encounter: Secondary | ICD-10-CM | POA: Insufficient documentation

## 2023-03-22 DIAGNOSIS — H548 Legal blindness, as defined in USA: Secondary | ICD-10-CM | POA: Diagnosis not present

## 2023-03-22 DIAGNOSIS — Z8673 Personal history of transient ischemic attack (TIA), and cerebral infarction without residual deficits: Secondary | ICD-10-CM | POA: Diagnosis not present

## 2023-03-22 DIAGNOSIS — D649 Anemia, unspecified: Secondary | ICD-10-CM | POA: Diagnosis not present

## 2023-03-22 DIAGNOSIS — M7522 Bicipital tendinitis, left shoulder: Secondary | ICD-10-CM | POA: Diagnosis not present

## 2023-03-22 DIAGNOSIS — S43432A Superior glenoid labrum lesion of left shoulder, initial encounter: Secondary | ICD-10-CM | POA: Insufficient documentation

## 2023-03-22 DIAGNOSIS — K219 Gastro-esophageal reflux disease without esophagitis: Secondary | ICD-10-CM | POA: Insufficient documentation

## 2023-03-22 DIAGNOSIS — M1990 Unspecified osteoarthritis, unspecified site: Secondary | ICD-10-CM | POA: Diagnosis not present

## 2023-03-22 DIAGNOSIS — Z7984 Long term (current) use of oral hypoglycemic drugs: Secondary | ICD-10-CM | POA: Diagnosis not present

## 2023-03-22 DIAGNOSIS — M25812 Other specified joint disorders, left shoulder: Secondary | ICD-10-CM | POA: Diagnosis not present

## 2023-03-22 DIAGNOSIS — E119 Type 2 diabetes mellitus without complications: Secondary | ICD-10-CM | POA: Diagnosis not present

## 2023-03-22 DIAGNOSIS — Z01812 Encounter for preprocedural laboratory examination: Secondary | ICD-10-CM

## 2023-03-22 DIAGNOSIS — M24112 Other articular cartilage disorders, left shoulder: Secondary | ICD-10-CM | POA: Diagnosis not present

## 2023-03-22 DIAGNOSIS — G8918 Other acute postprocedural pain: Secondary | ICD-10-CM | POA: Diagnosis not present

## 2023-03-22 DIAGNOSIS — M7582 Other shoulder lesions, left shoulder: Secondary | ICD-10-CM | POA: Diagnosis not present

## 2023-03-22 DIAGNOSIS — M7542 Impingement syndrome of left shoulder: Secondary | ICD-10-CM | POA: Diagnosis not present

## 2023-03-22 HISTORY — PX: SHOULDER ARTHROSCOPY WITH SUBACROMIAL DECOMPRESSION, ROTATOR CUFF REPAIR AND BICEP TENDON REPAIR: SHX5687

## 2023-03-22 LAB — GLUCOSE, CAPILLARY
Glucose-Capillary: 124 mg/dL — ABNORMAL HIGH (ref 70–99)
Glucose-Capillary: 123 mg/dL — ABNORMAL HIGH (ref 70–99)

## 2023-03-22 SURGERY — SHOULDER ARTHROSCOPY WITH SUBACROMIAL DECOMPRESSION, ROTATOR CUFF REPAIR AND BICEP TENDON REPAIR
Anesthesia: General | Site: Shoulder | Laterality: Left

## 2023-03-22 MED ORDER — SEVOFLURANE IN SOLN
RESPIRATORY_TRACT | Status: AC
  Filled 2023-03-22: qty 250

## 2023-03-22 MED ORDER — PROPOFOL 10 MG/ML IV BOLUS
INTRAVENOUS | Status: AC
  Filled 2023-03-22: qty 20

## 2023-03-22 MED ORDER — ONDANSETRON HCL 4 MG/2ML IJ SOLN
INTRAMUSCULAR | Status: DC | PRN
  Administered 2023-03-22: 4 mg via INTRAVENOUS

## 2023-03-22 MED ORDER — PROPOFOL 10 MG/ML IV BOLUS
INTRAVENOUS | Status: DC | PRN
  Administered 2023-03-22: 140 mg via INTRAVENOUS

## 2023-03-22 MED ORDER — DEXAMETHASONE SODIUM PHOSPHATE 10 MG/ML IJ SOLN
INTRAMUSCULAR | Status: DC | PRN
  Administered 2023-03-22: 10 mg via INTRAVENOUS

## 2023-03-22 MED ORDER — LIDOCAINE HCL (CARDIAC) PF 100 MG/5ML IV SOSY
PREFILLED_SYRINGE | INTRAVENOUS | Status: DC | PRN
  Administered 2023-03-22: 100 mg via INTRAVENOUS

## 2023-03-22 MED ORDER — MIDAZOLAM HCL 2 MG/2ML IJ SOLN
INTRAMUSCULAR | Status: DC | PRN
  Administered 2023-03-22: 1 mg via INTRAVENOUS

## 2023-03-22 MED ORDER — BUPIVACAINE-EPINEPHRINE (PF) 0.5% -1:200000 IJ SOLN
INTRAMUSCULAR | Status: AC
  Filled 2023-03-22: qty 30

## 2023-03-22 MED ORDER — MIDAZOLAM HCL 2 MG/2ML IJ SOLN
INTRAMUSCULAR | Status: AC
Start: 2023-03-22 — End: ?
  Filled 2023-03-22: qty 2

## 2023-03-22 MED ORDER — HYDROMORPHONE HCL 2 MG PO TABS
ORAL_TABLET | ORAL | Status: AC
  Filled 2023-03-22: qty 1

## 2023-03-22 MED ORDER — FENTANYL CITRATE (PF) 100 MCG/2ML IJ SOLN
INTRAMUSCULAR | Status: AC
  Filled 2023-03-22: qty 2

## 2023-03-22 MED ORDER — FENTANYL CITRATE PF 50 MCG/ML IJ SOSY
50.0000 ug | PREFILLED_SYRINGE | Freq: Once | INTRAMUSCULAR | Status: AC
  Administered 2023-03-22: 50 ug via INTRAVENOUS

## 2023-03-22 MED ORDER — DROPERIDOL 2.5 MG/ML IJ SOLN: 0.6250 mg | Freq: Once | INTRAMUSCULAR | Status: DC | PRN

## 2023-03-22 MED ORDER — PHENYLEPHRINE 80 MCG/ML (10ML) SYRINGE FOR IV PUSH (FOR BLOOD PRESSURE SUPPORT)
PREFILLED_SYRINGE | INTRAVENOUS | Status: AC
  Filled 2023-03-22: qty 10

## 2023-03-22 MED ORDER — LIDOCAINE HCL (PF) 1 % IJ SOLN
INTRAMUSCULAR | Status: AC
  Filled 2023-03-22: qty 5

## 2023-03-22 MED ORDER — PHENYLEPHRINE HCL-NACL 20-0.9 MG/250ML-% IV SOLN
INTRAVENOUS | Status: AC
  Filled 2023-03-22: qty 250

## 2023-03-22 MED ORDER — NEOSTIGMINE METHYLSULFATE 10 MG/10ML IV SOLN
INTRAVENOUS | Status: AC
  Filled 2023-03-22: qty 1

## 2023-03-22 MED ORDER — EPINEPHRINE PF 1 MG/ML IJ SOLN
INTRAMUSCULAR | Status: AC
  Filled 2023-03-22: qty 2

## 2023-03-22 MED ORDER — MIDAZOLAM HCL 2 MG/2ML IJ SOLN
INTRAMUSCULAR | Status: AC
  Filled 2023-03-22: qty 2

## 2023-03-22 MED ORDER — LIDOCAINE HCL (PF) 1 % IJ SOLN
INTRAMUSCULAR | Status: DC | PRN
  Administered 2023-03-22: 3 mL via SUBCUTANEOUS

## 2023-03-22 MED ORDER — BUPIVACAINE HCL (PF) 0.5 % IJ SOLN
INTRAMUSCULAR | Status: DC | PRN
  Administered 2023-03-22: 10 mL via PERINEURAL

## 2023-03-22 MED ORDER — PHENYLEPHRINE HCL-NACL 20-0.9 MG/250ML-% IV SOLN
INTRAVENOUS | Status: DC | PRN
  Administered 2023-03-22 (×2): 34 ug/min via INTRAVENOUS

## 2023-03-22 MED ORDER — FENTANYL CITRATE (PF) 100 MCG/2ML IJ SOLN
INTRAMUSCULAR | Status: DC | PRN
  Administered 2023-03-22: 50 ug via INTRAVENOUS

## 2023-03-22 MED ORDER — CHLORHEXIDINE GLUCONATE 0.12 % MT SOLN
OROMUCOSAL | Status: AC
  Filled 2023-03-22: qty 15

## 2023-03-22 MED ORDER — FENTANYL CITRATE (PF) 100 MCG/2ML IJ SOLN: 25.0000 ug | INTRAMUSCULAR | Status: DC | PRN

## 2023-03-22 MED ORDER — BUPIVACAINE LIPOSOME 1.3 % IJ SUSP
INTRAMUSCULAR | Status: AC
  Filled 2023-03-22: qty 20

## 2023-03-22 MED ORDER — BUPIVACAINE LIPOSOME 1.3 % IJ SUSP
INTRAMUSCULAR | Status: DC | PRN
  Administered 2023-03-22: 20 mL via PERINEURAL

## 2023-03-22 MED ORDER — EPHEDRINE SULFATE-NACL 50-0.9 MG/10ML-% IV SOSY
PREFILLED_SYRINGE | INTRAVENOUS | Status: DC | PRN
  Administered 2023-03-22: 10 mg via INTRAVENOUS

## 2023-03-22 MED ORDER — MIDAZOLAM HCL 2 MG/2ML IJ SOLN
1.0000 mg | INTRAMUSCULAR | Status: DC | PRN
  Administered 2023-03-22: 1 mg via INTRAVENOUS

## 2023-03-22 MED ORDER — CEFAZOLIN SODIUM-DEXTROSE 2-4 GM/100ML-% IV SOLN
INTRAVENOUS | Status: AC
  Filled 2023-03-22: qty 100

## 2023-03-22 MED ORDER — BUPIVACAINE-EPINEPHRINE 0.5% -1:200000 IJ SOLN
INTRAMUSCULAR | Status: DC | PRN
  Administered 2023-03-22: 30 mL

## 2023-03-22 MED ORDER — ROCURONIUM BROMIDE 100 MG/10ML IV SOLN
INTRAVENOUS | Status: DC | PRN
  Administered 2023-03-22: 50 mg via INTRAVENOUS

## 2023-03-22 MED ORDER — FENTANYL CITRATE PF 50 MCG/ML IJ SOSY
PREFILLED_SYRINGE | INTRAMUSCULAR | Status: AC
  Filled 2023-03-22: qty 1

## 2023-03-22 MED ORDER — HYDROMORPHONE HCL 2 MG PO TABS
1.0000 mg | ORAL_TABLET | ORAL | Status: DC | PRN
  Administered 2023-03-22: 1 mg via ORAL

## 2023-03-22 MED ORDER — SUGAMMADEX SODIUM 500 MG/5ML IV SOLN
INTRAVENOUS | Status: DC | PRN
  Administered 2023-03-22: 200 mg via INTRAVENOUS

## 2023-03-22 MED ORDER — BUPIVACAINE HCL (PF) 0.5 % IJ SOLN
INTRAMUSCULAR | Status: AC
  Filled 2023-03-22: qty 10

## 2023-03-22 MED ORDER — RINGERS IRRIGATION IR SOLN
Status: DC | PRN
  Administered 2023-03-22: 1000 mL
  Administered 2023-03-22: 3000 mL

## 2023-03-22 SURGICAL SUPPLY — 42 items
ANCHOR JUGGERKNOT WTAP NDL 2.9 (Anchor) IMPLANT
BIT DRILL JUGRKNT W/NDL BIT2.9 (DRILL) IMPLANT
BLADE FULL RADIUS 3.5 (BLADE) ×1 IMPLANT
BUR ACROMIONIZER 4.0 (BURR) ×1 IMPLANT
CHLORAPREP W/TINT 26 (MISCELLANEOUS) ×1 IMPLANT
COVER MAYO STAND STRL (DRAPES) ×1 IMPLANT
DILATOR 5.5 THREADED HEALICOIL (MISCELLANEOUS) IMPLANT
DRILL JUGGERKNOT W/NDL BIT 2.9 (DRILL) ×1
ELECT CAUTERY BLADE 6.4 (BLADE) ×1 IMPLANT
ELECT REM PT RETURN 9FT ADLT (ELECTROSURGICAL) ×1
ELECTRODE REM PT RTRN 9FT ADLT (ELECTROSURGICAL) ×1 IMPLANT
GAUZE SPONGE 4X4 12PLY STRL (GAUZE/BANDAGES/DRESSINGS) ×1 IMPLANT
GAUZE XEROFORM 1X8 LF (GAUZE/BANDAGES/DRESSINGS) ×1 IMPLANT
GLOVE BIO SURGEON STRL SZ7.5 (GLOVE) ×2 IMPLANT
GLOVE BIO SURGEON STRL SZ8 (GLOVE) ×2 IMPLANT
GLOVE BIOGEL PI IND STRL 8 (GLOVE) ×1 IMPLANT
GLOVE INDICATOR 8.0 STRL GRN (GLOVE) ×1 IMPLANT
GOWN STRL REUS W/ TWL LRG LVL3 (GOWN DISPOSABLE) ×1 IMPLANT
GOWN STRL REUS W/ TWL XL LVL3 (GOWN DISPOSABLE) ×1 IMPLANT
GRASPER SUT 15 45D LOW PRO (SUTURE) IMPLANT
IV LR IRRIG 3000ML ARTHROMATIC (IV SOLUTION) ×2 IMPLANT
KIT CANNULA 8X76-LX IN CANNULA (CANNULA) ×1 IMPLANT
MANIFOLD NEPTUNE II (INSTRUMENTS) ×1 IMPLANT
MASK FACE SPIDER DISP (MASK) ×1 IMPLANT
MAT ABSORB FLUID 56X50 GRAY (MISCELLANEOUS) ×1 IMPLANT
PACK ARTHROSCOPY SHOULDER (MISCELLANEOUS) ×1 IMPLANT
PAD ABD DERMACEA PRESS 5X9 (GAUZE/BANDAGES/DRESSINGS) ×2 IMPLANT
PASSER SUT FIRSTPASS SELF (INSTRUMENTS) IMPLANT
SLING ARM LRG DEEP (SOFTGOODS) ×1 IMPLANT
SLING ULTRA II LG (MISCELLANEOUS) ×1 IMPLANT
SPONGE T-LAP 18X18 ~~LOC~~+RFID (SPONGE) ×1 IMPLANT
STAPLER SKIN PROX 35W (STAPLE) ×1 IMPLANT
STRAP SAFETY 5IN WIDE (MISCELLANEOUS) ×1 IMPLANT
SUT ETHIBOND 0 MO6 C/R (SUTURE) ×1 IMPLANT
SUT ULTRABRAID 2 COBRAID 38 (SUTURE) IMPLANT
SUT VIC AB 2-0 CT1 TAPERPNT 27 (SUTURE) ×2 IMPLANT
TAPE MICROFOAM 4IN (TAPE) ×1 IMPLANT
TRAP FLUID SMOKE EVACUATOR (MISCELLANEOUS) ×1 IMPLANT
TUBE SET DOUBLEFLO INFLOW (TUBING) ×1 IMPLANT
TUBING CONNECTING 10 (TUBING) ×1 IMPLANT
WAND WEREWOLF FLOW 90D (MISCELLANEOUS) ×1 IMPLANT
WATER STERILE IRR 500ML POUR (IV SOLUTION) ×1 IMPLANT
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
## 2023-03-22 NOTE — Discharge Instructions (Addendum)
 Orthopedic discharge instructions: Keep dressing dry and intact.  May shower after dressing changed on post-op day #4 (Saturday evening).  Cover staples with Band-Aids after drying off. Apply ice frequently to shoulder. Take ibuprofen 600-800 mg TID with meals for 3-5 days, then as necessary. Take pain medication as prescribed when needed.  May supplement with ES Tylenol  if necessary. Keep shoulder immobilizer on at all times except may remove for bathing purposes. Follow-up in 10-14 days or as scheduled. SHOULDER SLING IMMOBILIZER   VIDEO Slingshot 2 Shoulder Brace Application - YouTube ---https://www.porter.info/  INSTRUCTIONS While supporting the injured arm, slide the forearm into the sling. Wrap the adjustable shoulder strap around the neck and shoulders and attach the strap end to the sling using  the "alligator strap tab."  Adjust the shoulder strap to the required length. Position the shoulder pad behind the neck. To secure the shoulder pad location (optional), pull the shoulder strap away from the shoulder pad, unfold the hook material on the top of the pad, then press the shoulder strap back onto the hook material to secure the pad in place. Attach the closure strap across the open top of the sling. Position the strap so that it holds the arm securely in the sling. Next, attach the thumb strap to the open end of the sling between the thumb and fingers. After sling has been fit, it may be easily removed and reapplied using the quick release buckle on shoulder strap. If a neutral pillow or 15 abduction pillow is included, place the pillow at the waistline. Attach the sling to the pillow, lining up hook material on the pillow with the loop on sling. Adjust the waist strap to fit.  If waist strap is too long, cut it to fit. Use the small piece of double sided hook material (located on top of the pillow) to secure the strap end. Place the double sided hook material on  the inside of the cut strap end and secure it to the waist strap.     If no pillow is included, attach the waist strap to the sling and adjust to fit.    Washing Instructions: Straps and sling must be removed and cleaned regularly depending on your activity level and perspiration. Hand wash straps and sling in cold water with mild detergent, rinse, air dry
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
## 2023-03-22 NOTE — Anesthesia Procedure Notes (Signed)
 Procedure Name: Intubation Date/Time: 03/22/2023 7:47 AM  Performed by: Ezzard Holms, CRNAPre-anesthesia Checklist: Patient identified, Patient being monitored, Timeout performed, Emergency Drugs available and Suction available Patient Re-evaluated:Patient Re-evaluated prior to induction Oxygen Delivery Method: Circle system utilized Preoxygenation: Pre-oxygenation with 100% oxygen Induction Type: IV induction Ventilation: Mask ventilation without difficulty Laryngoscope Size: Mac and 3 Grade View: Grade I Tube type: Oral Tube size: 7.5 mm Number of attempts: 1 Airway Equipment and Method: Stylet Placement Confirmation: ETT inserted through vocal cords under direct vision, positive ETCO2 and breath sounds checked- equal and bilateral Secured at: 23 cm Tube secured with: Tape Dental Injury: Teeth and Oropharynx as per pre-operative assessment

## 2023-03-22 NOTE — Anesthesia Procedure Notes (Addendum)
 Anesthesia Regional Block: Interscalene brachial plexus block   Pre-Anesthetic Checklist: , timeout performed,  Correct Patient, Correct Site, Correct Laterality,  Correct Procedure, Correct Position, site marked,  Risks and benefits discussed,  Surgical consent,  Pre-op evaluation,  At surgeon's request and post-op pain management  Laterality: Upper and Left  Prep: chloraprep       Needles:  Injection technique: Single-shot  Needle Type: Stimiplex     Needle Length: 5cm  Needle Gauge: 22     Additional Needles:   Procedures:,,,, ultrasound used (permanent image in chart),,    Narrative:  Start time: 03/22/2023 7:26 AM End time: 03/22/2023 7:28 AM Injection made incrementally with aspirations every 5 mL.  Performed by: Personally  Anesthesiologist: Vanice Genre, MD  Additional Notes: Functioning IV was confirmed and monitors were applied.  A 50mm 22ga Stimuplex needle was used. Sterile prep and drape,hand hygiene and sterile gloves were used.  Negative aspiration and negative test dose prior to incremental administration of local anesthetic. The patient tolerated the procedure well.

## 2023-03-22 NOTE — Transfer of Care (Signed)
 Immediate Anesthesia Transfer of Care Note  Patient: Ronald Trujillo  Procedure(s) Performed: LEFT SHOULDER ARTHROSCOPY WITH DEBRIDEMENT, DECOMPRESSION, BICEPS TENODESIS. - RNFA (Left: Shoulder)  Patient Location: PACU  Anesthesia Type:General  Level of Consciousness: drowsy  Airway & Oxygen Therapy: Patient Spontanous Breathing and Patient connected to face mask oxygen  Post-op Assessment: Report given to RN, Post -op Vital signs reviewed and stable, and Patient moving all extremities  Post vital signs: Reviewed and stable  Last Vitals:  Vitals Value Taken Time  BP 120/79 03/22/23 0922  Temp 36.2 C 03/22/23 0922  Pulse 74 03/22/23 0928  Resp 15 03/22/23 0928  SpO2 97 % 03/22/23 0928  Vitals shown include unfiled device data.  Last Pain:  Vitals:   03/22/23 0922  TempSrc:   PainSc: Asleep         Complications: No notable events documented.

## 2023-03-22 NOTE — Op Note (Signed)
 03/22/2023  9:17 AM  Patient:   Ronald Trujillo  Pre-Op Diagnosis:   Impingement/tendinopathy with biceps tendinopathy, left shoulder.  Post-Op Diagnosis:   Impingement/tendinopathy with type I labral tear and biceps tendinopathy, left shoulder.  Procedure:   Limited arthroscopic debridement, arthroscopic subacromial decompression, and mini-open biceps tenodesis, left shoulder.  Anesthesia:   General endotracheal with interscalene block using Exparel  placed preoperatively by the anesthesiologist.  Surgeon:   Lonnie Roberts, MD  Assistant:   Ashton Blakes, RNFA  Findings:   As above. There was moderate labral fraying/tearing involving the superior and posterior superior portions of the labrum without frank detachment from the glenoid rim. The rotator cuff demonstrated areas of inflammation but otherwise was intact. The biceps tendon demonstrated significant tendinopathic changes without partial full-thickness tearing. The articular surfaces of the glenoid and humerus both were in excellent condition.  Complications:   None  Fluids:   800 cc  Estimated blood loss:   10 cc  Tourniquet time:   None  Drains:   None  Closure:   Staples      Brief clinical note:   The patient is a 63 year old male with a history of progressively worsening left shoulder pain. The patient's symptoms have progressed despite medications, activity modification, etc. The patient's history and examination are consistent with impingement/tendinopathy with a possible rotator cuff tear. Preoperative MRI scanning confirmed the presence of impingement/tendinopathy, as well as biceps tendinopathy, but no evidence for partial or full-thickness rotator cuff tears. The patient presents at this time for definitive management of his shoulder symptoms.  Procedure:   The patient underwent placement of an interscalene block using Exparel  by the anesthesiologist in the preoperative holding area before being brought into the  operating room and lain in the supine position. The patient then underwent general endotracheal intubation and anesthesia before being repositioned in the beach chair position using the beach chair positioner. The left shoulder and upper extremity were prepped with ChloraPrep solution before being draped sterilely. Preoperative antibiotics were administered. A timeout was performed to confirm the proper surgical site before the expected portal sites and incision site were injected with 0.5% Sensorcaine  with epinephrine .   A posterior portal was created and the glenohumeral joint thoroughly inspected with the findings as described above. An anterior portal was created using an outside-in technique. The labrum and rotator cuff were further probed, again confirming the above-noted findings. The areas of labral fraying were debrided back to stable margins using the full-radius resector, as were areas of synovitis anteriorly and superiorly. The ArthroCare wand was inserted and used to release the biceps tendon from its labral anchor.  It was also was used to obtain hemostasis as well as to "anneal" the labrum superiorly and anteriorly. The instruments were removed from the joint after suctioning the excess fluid.  The camera was repositioned through the posterior portal into the subacromial space. A separate lateral portal was created using an outside-in technique. The 3.5 mm full-radius resector was introduced and used to perform a subtotal bursectomy. The ArthroCare wand was then inserted and used to remove the periosteal tissue off the undersurface of the anterior third of the acromion as well as to recess the coracoacromial ligament from its attachment along the anterior and lateral margins of the acromion. The 4.0 mm acromionizing bur was introduced and used to complete the decompression by removing the undersurface of the anterior third of the acromion. The full radius resector was reintroduced to remove any  residual bony debris before  the ArthroCare wand was reintroduced to obtain hemostasis. The instruments were then removed from the subacromial space after suctioning the excess fluid.  An approximately 4-5 cm incision was made over the anterolateral aspect of the shoulder beginning at the anterolateral corner of the acromion and extending distally in line with the bicipital groove. This incision was carried down through the subcutaneous tissues to expose the deltoid fascia. The raphae between the anterior and middle thirds was identified and this plane developed to provide access into the subacromial space. Additional bursal tissues were debrided sharply using Metzenbaum scissors. The rotator cuff was carefully inspected and found to be intact.  The bicipital groove was identified by palpation and opened for 1-1.5 cm. The biceps tendon stump was retrieved through this defect. The floor of the bicipital groove was roughened with a curet before another Biomet 2.9 mm JuggerKnot anchor was inserted. Both sets of sutures were passed through the biceps tendon and tied securely to effect the tenodesis. The bicipital sheath was reapproximated using two #0 Ethibond interrupted sutures, incorporating the biceps tendon to further reinforce the tenodesis.  The wound was copiously irrigated with sterile saline solution before the deltoid raphae was reapproximated using 2-0 Vicryl interrupted sutures. The subcutaneous tissues were closed in two layers using 2-0 Vicryl interrupted sutures before the skin was closed using staples. The portal sites also were closed using staples. A sterile bulky dressing was applied to the shoulder before the arm was placed into a shoulder immobilizer. The patient was then awakened, extubated, and returned to the recovery room in satisfactory condition after tolerating the procedure well.

## 2023-03-23 ENCOUNTER — Encounter: Payer: Self-pay | Admitting: Surgery

## 2023-03-24 NOTE — Anesthesia Postprocedure Evaluation (Signed)
Anesthesia Post Note  Patient: Ronald Trujillo  Procedure(s) Performed: LEFT SHOULDER ARTHROSCOPY WITH DEBRIDEMENT, DECOMPRESSION, BICEPS TENODESIS. - RNFA (Left: Shoulder)  Patient location during evaluation: PACU Anesthesia Type: General Level of consciousness: awake and alert Pain management: pain level controlled Vital Signs Assessment: post-procedure vital signs reviewed and stable Respiratory status: spontaneous breathing, nonlabored ventilation, respiratory function stable and patient connected to nasal cannula oxygen Cardiovascular status: blood pressure returned to baseline and stable Postop Assessment: no apparent nausea or vomiting Anesthetic complications: no   No notable events documented.   Last Vitals:  Vitals:   03/22/23 1011 03/22/23 1021  BP: 123/87 128/81  Pulse: 72 80  Resp: 15 20  Temp: (!) 36.2 C (!) 36.1 C  SpO2: 96% 92%    Last Pain:  Vitals:   03/23/23 0818  TempSrc:   PainSc: 0-No pain                 Lenard Simmer

## 2023-03-27 DIAGNOSIS — Z9889 Other specified postprocedural states: Secondary | ICD-10-CM | POA: Diagnosis not present

## 2023-03-27 DIAGNOSIS — M25512 Pain in left shoulder: Secondary | ICD-10-CM | POA: Diagnosis not present

## 2023-03-29 ENCOUNTER — Ambulatory Visit: Payer: PPO | Admitting: Physical Therapy

## 2023-04-04 DIAGNOSIS — M25512 Pain in left shoulder: Secondary | ICD-10-CM | POA: Diagnosis not present

## 2023-04-04 DIAGNOSIS — Z9889 Other specified postprocedural states: Secondary | ICD-10-CM | POA: Diagnosis not present

## 2023-04-05 ENCOUNTER — Ambulatory Visit: Payer: PPO | Admitting: Physical Therapy

## 2023-04-07 DIAGNOSIS — E7849 Other hyperlipidemia: Secondary | ICD-10-CM | POA: Diagnosis not present

## 2023-04-07 DIAGNOSIS — E1129 Type 2 diabetes mellitus with other diabetic kidney complication: Secondary | ICD-10-CM | POA: Diagnosis not present

## 2023-04-07 DIAGNOSIS — Z125 Encounter for screening for malignant neoplasm of prostate: Secondary | ICD-10-CM | POA: Diagnosis not present

## 2023-04-07 DIAGNOSIS — D649 Anemia, unspecified: Secondary | ICD-10-CM | POA: Diagnosis not present

## 2023-04-07 DIAGNOSIS — R809 Proteinuria, unspecified: Secondary | ICD-10-CM | POA: Diagnosis not present

## 2023-04-07 DIAGNOSIS — I10 Essential (primary) hypertension: Secondary | ICD-10-CM | POA: Diagnosis not present

## 2023-04-07 DIAGNOSIS — E538 Deficiency of other specified B group vitamins: Secondary | ICD-10-CM | POA: Diagnosis not present

## 2023-04-11 DIAGNOSIS — M25512 Pain in left shoulder: Secondary | ICD-10-CM | POA: Diagnosis not present

## 2023-04-11 DIAGNOSIS — Z9889 Other specified postprocedural states: Secondary | ICD-10-CM | POA: Diagnosis not present

## 2023-04-12 ENCOUNTER — Ambulatory Visit: Payer: PPO | Admitting: Physical Therapy

## 2023-04-12 DIAGNOSIS — B351 Tinea unguium: Secondary | ICD-10-CM | POA: Diagnosis not present

## 2023-04-12 DIAGNOSIS — E1142 Type 2 diabetes mellitus with diabetic polyneuropathy: Secondary | ICD-10-CM | POA: Diagnosis not present

## 2023-04-12 DIAGNOSIS — M79675 Pain in left toe(s): Secondary | ICD-10-CM | POA: Diagnosis not present

## 2023-04-12 DIAGNOSIS — M79674 Pain in right toe(s): Secondary | ICD-10-CM | POA: Diagnosis not present

## 2023-04-14 DIAGNOSIS — R768 Other specified abnormal immunological findings in serum: Secondary | ICD-10-CM | POA: Diagnosis not present

## 2023-04-14 DIAGNOSIS — Z0000 Encounter for general adult medical examination without abnormal findings: Secondary | ICD-10-CM | POA: Diagnosis not present

## 2023-04-14 DIAGNOSIS — R519 Headache, unspecified: Secondary | ICD-10-CM | POA: Diagnosis not present

## 2023-04-14 DIAGNOSIS — K219 Gastro-esophageal reflux disease without esophagitis: Secondary | ICD-10-CM | POA: Diagnosis not present

## 2023-04-14 DIAGNOSIS — E1129 Type 2 diabetes mellitus with other diabetic kidney complication: Secondary | ICD-10-CM | POA: Diagnosis not present

## 2023-04-14 DIAGNOSIS — E611 Iron deficiency: Secondary | ICD-10-CM | POA: Diagnosis not present

## 2023-04-14 DIAGNOSIS — R809 Proteinuria, unspecified: Secondary | ICD-10-CM | POA: Diagnosis not present

## 2023-04-14 DIAGNOSIS — I10 Essential (primary) hypertension: Secondary | ICD-10-CM | POA: Diagnosis not present

## 2023-04-14 DIAGNOSIS — E7849 Other hyperlipidemia: Secondary | ICD-10-CM | POA: Diagnosis not present

## 2023-04-14 DIAGNOSIS — D649 Anemia, unspecified: Secondary | ICD-10-CM | POA: Diagnosis not present

## 2023-04-14 DIAGNOSIS — E538 Deficiency of other specified B group vitamins: Secondary | ICD-10-CM | POA: Diagnosis not present

## 2023-04-18 DIAGNOSIS — M25512 Pain in left shoulder: Secondary | ICD-10-CM | POA: Diagnosis not present

## 2023-04-18 DIAGNOSIS — Z9889 Other specified postprocedural states: Secondary | ICD-10-CM | POA: Diagnosis not present

## 2023-04-19 ENCOUNTER — Ambulatory Visit: Payer: PPO | Admitting: Physical Therapy

## 2023-04-25 DIAGNOSIS — M25512 Pain in left shoulder: Secondary | ICD-10-CM | POA: Diagnosis not present

## 2023-04-25 DIAGNOSIS — Z9889 Other specified postprocedural states: Secondary | ICD-10-CM | POA: Diagnosis not present

## 2023-04-26 ENCOUNTER — Ambulatory Visit: Payer: PPO | Admitting: Physical Therapy

## 2023-04-28 DIAGNOSIS — M7522 Bicipital tendinitis, left shoulder: Secondary | ICD-10-CM | POA: Diagnosis not present

## 2023-04-28 DIAGNOSIS — Z9889 Other specified postprocedural states: Secondary | ICD-10-CM | POA: Diagnosis not present

## 2023-04-28 DIAGNOSIS — G8911 Acute pain due to trauma: Secondary | ICD-10-CM | POA: Diagnosis not present

## 2023-04-28 DIAGNOSIS — G8929 Other chronic pain: Secondary | ICD-10-CM | POA: Diagnosis not present

## 2023-04-28 DIAGNOSIS — M25552 Pain in left hip: Secondary | ICD-10-CM | POA: Diagnosis not present

## 2023-04-28 DIAGNOSIS — M25512 Pain in left shoulder: Secondary | ICD-10-CM | POA: Diagnosis not present

## 2023-04-28 DIAGNOSIS — S51012A Laceration without foreign body of left elbow, initial encounter: Secondary | ICD-10-CM | POA: Diagnosis not present

## 2023-04-28 DIAGNOSIS — M25522 Pain in left elbow: Secondary | ICD-10-CM | POA: Diagnosis not present

## 2023-05-01 ENCOUNTER — Other Ambulatory Visit: Payer: Self-pay | Admitting: Student

## 2023-05-01 DIAGNOSIS — G8911 Acute pain due to trauma: Secondary | ICD-10-CM

## 2023-05-02 DIAGNOSIS — M25512 Pain in left shoulder: Secondary | ICD-10-CM | POA: Diagnosis not present

## 2023-05-02 DIAGNOSIS — Z9889 Other specified postprocedural states: Secondary | ICD-10-CM | POA: Diagnosis not present

## 2023-05-03 ENCOUNTER — Ambulatory Visit: Payer: PPO | Admitting: Physical Therapy

## 2023-05-09 DIAGNOSIS — Z9889 Other specified postprocedural states: Secondary | ICD-10-CM | POA: Diagnosis not present

## 2023-05-09 DIAGNOSIS — M25512 Pain in left shoulder: Secondary | ICD-10-CM | POA: Diagnosis not present

## 2023-05-16 DIAGNOSIS — Z9889 Other specified postprocedural states: Secondary | ICD-10-CM | POA: Diagnosis not present

## 2023-05-16 DIAGNOSIS — M25512 Pain in left shoulder: Secondary | ICD-10-CM | POA: Diagnosis not present

## 2023-05-23 DIAGNOSIS — Z9889 Other specified postprocedural states: Secondary | ICD-10-CM | POA: Diagnosis not present

## 2023-05-23 DIAGNOSIS — M25512 Pain in left shoulder: Secondary | ICD-10-CM | POA: Diagnosis not present

## 2023-05-30 DIAGNOSIS — M25512 Pain in left shoulder: Secondary | ICD-10-CM | POA: Diagnosis not present

## 2023-05-30 DIAGNOSIS — Z9889 Other specified postprocedural states: Secondary | ICD-10-CM | POA: Diagnosis not present

## 2023-06-06 DIAGNOSIS — M25512 Pain in left shoulder: Secondary | ICD-10-CM | POA: Diagnosis not present

## 2023-06-06 DIAGNOSIS — Z9889 Other specified postprocedural states: Secondary | ICD-10-CM | POA: Diagnosis not present

## 2023-06-13 DIAGNOSIS — M25512 Pain in left shoulder: Secondary | ICD-10-CM | POA: Diagnosis not present

## 2023-06-13 DIAGNOSIS — Z9889 Other specified postprocedural states: Secondary | ICD-10-CM | POA: Diagnosis not present

## 2023-06-20 DIAGNOSIS — Z9889 Other specified postprocedural states: Secondary | ICD-10-CM | POA: Diagnosis not present

## 2023-06-20 DIAGNOSIS — M25512 Pain in left shoulder: Secondary | ICD-10-CM | POA: Diagnosis not present

## 2023-06-27 DIAGNOSIS — M25512 Pain in left shoulder: Secondary | ICD-10-CM | POA: Diagnosis not present

## 2023-06-27 DIAGNOSIS — Z9889 Other specified postprocedural states: Secondary | ICD-10-CM | POA: Diagnosis not present

## 2023-07-04 DIAGNOSIS — M25512 Pain in left shoulder: Secondary | ICD-10-CM | POA: Diagnosis not present

## 2023-07-04 DIAGNOSIS — Z9889 Other specified postprocedural states: Secondary | ICD-10-CM | POA: Diagnosis not present

## 2023-07-05 DIAGNOSIS — R42 Dizziness and giddiness: Secondary | ICD-10-CM | POA: Diagnosis not present

## 2023-07-05 DIAGNOSIS — H6063 Unspecified chronic otitis externa, bilateral: Secondary | ICD-10-CM | POA: Diagnosis not present

## 2023-07-05 DIAGNOSIS — R519 Headache, unspecified: Secondary | ICD-10-CM | POA: Diagnosis not present

## 2023-07-05 DIAGNOSIS — Q180 Sinus, fistula and cyst of branchial cleft: Secondary | ICD-10-CM | POA: Diagnosis not present

## 2023-07-11 DIAGNOSIS — Z9889 Other specified postprocedural states: Secondary | ICD-10-CM | POA: Diagnosis not present

## 2023-07-11 DIAGNOSIS — M25512 Pain in left shoulder: Secondary | ICD-10-CM | POA: Diagnosis not present

## 2023-07-18 DIAGNOSIS — Z9889 Other specified postprocedural states: Secondary | ICD-10-CM | POA: Diagnosis not present

## 2023-07-18 DIAGNOSIS — M25512 Pain in left shoulder: Secondary | ICD-10-CM | POA: Diagnosis not present

## 2023-07-25 DIAGNOSIS — Z9889 Other specified postprocedural states: Secondary | ICD-10-CM | POA: Diagnosis not present

## 2023-07-25 DIAGNOSIS — M25512 Pain in left shoulder: Secondary | ICD-10-CM | POA: Diagnosis not present

## 2023-08-01 DIAGNOSIS — M25512 Pain in left shoulder: Secondary | ICD-10-CM | POA: Diagnosis not present

## 2023-08-01 DIAGNOSIS — Z9889 Other specified postprocedural states: Secondary | ICD-10-CM | POA: Diagnosis not present

## 2023-08-08 DIAGNOSIS — Z9889 Other specified postprocedural states: Secondary | ICD-10-CM | POA: Diagnosis not present

## 2023-08-08 DIAGNOSIS — M25512 Pain in left shoulder: Secondary | ICD-10-CM | POA: Diagnosis not present

## 2023-08-15 DIAGNOSIS — M25512 Pain in left shoulder: Secondary | ICD-10-CM | POA: Diagnosis not present

## 2023-08-15 DIAGNOSIS — Z9889 Other specified postprocedural states: Secondary | ICD-10-CM | POA: Diagnosis not present

## 2023-08-22 DIAGNOSIS — M25512 Pain in left shoulder: Secondary | ICD-10-CM | POA: Diagnosis not present

## 2023-08-22 DIAGNOSIS — Z9889 Other specified postprocedural states: Secondary | ICD-10-CM | POA: Diagnosis not present

## 2023-08-29 DIAGNOSIS — M25512 Pain in left shoulder: Secondary | ICD-10-CM | POA: Diagnosis not present

## 2023-08-29 DIAGNOSIS — Z9889 Other specified postprocedural states: Secondary | ICD-10-CM | POA: Diagnosis not present

## 2023-09-28 DIAGNOSIS — G43711 Chronic migraine without aura, intractable, with status migrainosus: Secondary | ICD-10-CM | POA: Diagnosis not present

## 2023-09-28 DIAGNOSIS — Z8673 Personal history of transient ischemic attack (TIA), and cerebral infarction without residual deficits: Secondary | ICD-10-CM | POA: Diagnosis not present

## 2023-10-06 DIAGNOSIS — E538 Deficiency of other specified B group vitamins: Secondary | ICD-10-CM | POA: Diagnosis not present

## 2023-10-06 DIAGNOSIS — R809 Proteinuria, unspecified: Secondary | ICD-10-CM | POA: Diagnosis not present

## 2023-10-06 DIAGNOSIS — E611 Iron deficiency: Secondary | ICD-10-CM | POA: Diagnosis not present

## 2023-10-06 DIAGNOSIS — E1129 Type 2 diabetes mellitus with other diabetic kidney complication: Secondary | ICD-10-CM | POA: Diagnosis not present

## 2023-10-06 DIAGNOSIS — E7849 Other hyperlipidemia: Secondary | ICD-10-CM | POA: Diagnosis not present

## 2023-10-13 DIAGNOSIS — E7849 Other hyperlipidemia: Secondary | ICD-10-CM | POA: Diagnosis not present

## 2023-10-13 DIAGNOSIS — E1129 Type 2 diabetes mellitus with other diabetic kidney complication: Secondary | ICD-10-CM | POA: Diagnosis not present

## 2023-10-13 DIAGNOSIS — E611 Iron deficiency: Secondary | ICD-10-CM | POA: Diagnosis not present

## 2023-10-13 DIAGNOSIS — D649 Anemia, unspecified: Secondary | ICD-10-CM | POA: Diagnosis not present

## 2023-10-13 DIAGNOSIS — I10 Essential (primary) hypertension: Secondary | ICD-10-CM | POA: Diagnosis not present

## 2023-10-13 DIAGNOSIS — E538 Deficiency of other specified B group vitamins: Secondary | ICD-10-CM | POA: Diagnosis not present

## 2023-10-13 DIAGNOSIS — Z125 Encounter for screening for malignant neoplasm of prostate: Secondary | ICD-10-CM | POA: Diagnosis not present

## 2023-10-13 DIAGNOSIS — M519 Unspecified thoracic, thoracolumbar and lumbosacral intervertebral disc disorder: Secondary | ICD-10-CM | POA: Diagnosis not present

## 2023-10-13 DIAGNOSIS — R768 Other specified abnormal immunological findings in serum: Secondary | ICD-10-CM | POA: Diagnosis not present

## 2023-10-13 DIAGNOSIS — Z86007 Personal history of in-situ neoplasm of skin: Secondary | ICD-10-CM | POA: Diagnosis not present

## 2023-10-13 DIAGNOSIS — R490 Dysphonia: Secondary | ICD-10-CM | POA: Diagnosis not present

## 2023-10-13 DIAGNOSIS — M5416 Radiculopathy, lumbar region: Secondary | ICD-10-CM | POA: Diagnosis not present

## 2023-11-16 DIAGNOSIS — D2271 Melanocytic nevi of right lower limb, including hip: Secondary | ICD-10-CM | POA: Diagnosis not present

## 2023-11-16 DIAGNOSIS — L570 Actinic keratosis: Secondary | ICD-10-CM | POA: Diagnosis not present

## 2023-11-16 DIAGNOSIS — Z86018 Personal history of other benign neoplasm: Secondary | ICD-10-CM | POA: Diagnosis not present

## 2023-11-16 DIAGNOSIS — L2089 Other atopic dermatitis: Secondary | ICD-10-CM | POA: Diagnosis not present

## 2023-11-16 DIAGNOSIS — L578 Other skin changes due to chronic exposure to nonionizing radiation: Secondary | ICD-10-CM | POA: Diagnosis not present

## 2023-11-16 DIAGNOSIS — D2272 Melanocytic nevi of left lower limb, including hip: Secondary | ICD-10-CM | POA: Diagnosis not present

## 2023-11-16 DIAGNOSIS — Z872 Personal history of diseases of the skin and subcutaneous tissue: Secondary | ICD-10-CM | POA: Diagnosis not present

## 2023-11-16 DIAGNOSIS — D485 Neoplasm of uncertain behavior of skin: Secondary | ICD-10-CM | POA: Diagnosis not present

## 2023-12-12 DIAGNOSIS — D2371 Other benign neoplasm of skin of right lower limb, including hip: Secondary | ICD-10-CM | POA: Diagnosis not present

## 2023-12-21 DIAGNOSIS — E119 Type 2 diabetes mellitus without complications: Secondary | ICD-10-CM | POA: Diagnosis not present

## 2023-12-21 DIAGNOSIS — H2513 Age-related nuclear cataract, bilateral: Secondary | ICD-10-CM | POA: Diagnosis not present

## 2023-12-21 DIAGNOSIS — H47013 Ischemic optic neuropathy, bilateral: Secondary | ICD-10-CM | POA: Diagnosis not present

## 2024-01-01 DIAGNOSIS — E1142 Type 2 diabetes mellitus with diabetic polyneuropathy: Secondary | ICD-10-CM | POA: Diagnosis not present

## 2024-01-01 DIAGNOSIS — L603 Nail dystrophy: Secondary | ICD-10-CM | POA: Diagnosis not present

## 2024-01-08 DIAGNOSIS — S83241A Other tear of medial meniscus, current injury, right knee, initial encounter: Secondary | ICD-10-CM | POA: Diagnosis not present

## 2024-01-08 DIAGNOSIS — M2241 Chondromalacia patellae, right knee: Secondary | ICD-10-CM | POA: Diagnosis not present

## 2024-01-08 DIAGNOSIS — M1711 Unilateral primary osteoarthritis, right knee: Secondary | ICD-10-CM | POA: Diagnosis not present
